# Patient Record
Sex: Female | Born: 1960 | Race: Black or African American | Hispanic: No | Marital: Married | Smoking: Never smoker
Health system: Southern US, Community
[De-identification: ages and names within clinical notes are randomized; demographics above are authoritative.]

## PROBLEM LIST (undated history)

## (undated) DIAGNOSIS — J45909 Unspecified asthma, uncomplicated: Secondary | ICD-10-CM

## (undated) DIAGNOSIS — R011 Cardiac murmur, unspecified: Secondary | ICD-10-CM

## (undated) DIAGNOSIS — K259 Gastric ulcer, unspecified as acute or chronic, without hemorrhage or perforation: Secondary | ICD-10-CM

## (undated) DIAGNOSIS — E0590 Thyrotoxicosis, unspecified without thyrotoxic crisis or storm: Secondary | ICD-10-CM

## (undated) DIAGNOSIS — K219 Gastro-esophageal reflux disease without esophagitis: Secondary | ICD-10-CM

## (undated) DIAGNOSIS — R7303 Prediabetes: Secondary | ICD-10-CM

## (undated) DIAGNOSIS — I10 Essential (primary) hypertension: Secondary | ICD-10-CM

## (undated) DIAGNOSIS — M1990 Unspecified osteoarthritis, unspecified site: Secondary | ICD-10-CM

## (undated) DIAGNOSIS — Z9889 Other specified postprocedural states: Secondary | ICD-10-CM

## (undated) DIAGNOSIS — Z87442 Personal history of urinary calculi: Secondary | ICD-10-CM

## (undated) DIAGNOSIS — D649 Anemia, unspecified: Secondary | ICD-10-CM

## (undated) DIAGNOSIS — E119 Type 2 diabetes mellitus without complications: Secondary | ICD-10-CM

## (undated) HISTORY — DX: Gastro-esophageal reflux disease without esophagitis: K21.9

## (undated) HISTORY — PX: CHOLECYSTECTOMY: SHX55

## (undated) HISTORY — PX: ABDOMINAL HYSTERECTOMY: SHX81

## (undated) HISTORY — DX: Cardiac murmur, unspecified: R01.1

## (undated) HISTORY — DX: Unspecified asthma, uncomplicated: J45.909

## (undated) HISTORY — DX: Type 2 diabetes mellitus without complications: E11.9

## (undated) HISTORY — DX: Unspecified osteoarthritis, unspecified site: M19.90

---

## 1989-04-01 HISTORY — PX: ABDOMINAL HYSTERECTOMY: SHX81

## 1996-04-01 HISTORY — PX: CHOLECYSTECTOMY: SHX55

## 1997-04-01 HISTORY — PX: CARPAL TUNNEL RELEASE: SHX101

## 2003-05-10 ENCOUNTER — Other Ambulatory Visit: Payer: Self-pay

## 2003-10-04 ENCOUNTER — Other Ambulatory Visit: Payer: Self-pay

## 2004-01-25 ENCOUNTER — Emergency Department: Payer: Self-pay | Admitting: Emergency Medicine

## 2004-04-01 HISTORY — PX: BREAST BIOPSY: SHX20

## 2005-01-09 ENCOUNTER — Emergency Department: Payer: Self-pay | Admitting: Emergency Medicine

## 2005-01-09 ENCOUNTER — Other Ambulatory Visit: Payer: Self-pay

## 2005-02-27 ENCOUNTER — Emergency Department: Payer: Self-pay | Admitting: Emergency Medicine

## 2005-03-06 ENCOUNTER — Ambulatory Visit: Payer: Self-pay

## 2005-03-12 ENCOUNTER — Ambulatory Visit: Payer: Self-pay

## 2005-03-26 ENCOUNTER — Emergency Department: Payer: Self-pay | Admitting: Emergency Medicine

## 2005-03-26 ENCOUNTER — Other Ambulatory Visit: Payer: Self-pay

## 2005-09-11 ENCOUNTER — Ambulatory Visit: Payer: Self-pay | Admitting: Unknown Physician Specialty

## 2005-09-15 ENCOUNTER — Emergency Department: Payer: Self-pay | Admitting: Emergency Medicine

## 2005-12-11 ENCOUNTER — Emergency Department: Payer: Self-pay | Admitting: Emergency Medicine

## 2006-04-28 ENCOUNTER — Emergency Department: Payer: Self-pay | Admitting: Emergency Medicine

## 2006-04-28 ENCOUNTER — Other Ambulatory Visit: Payer: Self-pay

## 2006-05-13 ENCOUNTER — Ambulatory Visit: Payer: Self-pay

## 2006-06-03 ENCOUNTER — Emergency Department: Payer: Self-pay | Admitting: Emergency Medicine

## 2007-04-02 DIAGNOSIS — T8859XA Other complications of anesthesia, initial encounter: Secondary | ICD-10-CM

## 2007-04-02 HISTORY — PX: COLONOSCOPY: SHX174

## 2007-04-02 HISTORY — DX: Other complications of anesthesia, initial encounter: T88.59XA

## 2012-09-02 ENCOUNTER — Ambulatory Visit: Payer: Self-pay

## 2012-12-14 ENCOUNTER — Emergency Department: Payer: Self-pay | Admitting: Emergency Medicine

## 2012-12-14 LAB — BASIC METABOLIC PANEL
BUN: 12 mg/dL (ref 7–18)
Chloride: 108 mmol/L — ABNORMAL HIGH (ref 98–107)
Creatinine: 0.81 mg/dL (ref 0.60–1.30)
EGFR (African American): >60
Osmolality: 277 (ref 275–301)
Sodium: 139 mmol/L (ref 136–145)

## 2012-12-14 LAB — CBC
HCT: 35.8 % (ref 35.0–47.0)
MCH: 27.5 pg (ref 26.0–34.0)
MCHC: 33.3 g/dL (ref 32.0–36.0)
MCV: 83 fL (ref 80–100)
Platelet: 487 10*3/uL — ABNORMAL HIGH (ref 150–440)
RBC: 4.34 10*6/uL (ref 3.80–5.20)
RDW: 14.8 % — ABNORMAL HIGH (ref 11.5–14.5)
WBC: 6.5 10*3/uL (ref 3.6–11.0)

## 2012-12-14 LAB — TROPONIN I: Troponin-I: <0.02 ng/mL

## 2012-12-14 LAB — CK TOTAL AND CKMB (NOT AT ARMC)
CK, Total: 79 U/L (ref 21–215)
CK-MB: <0.5 ng/mL — ABNORMAL LOW (ref 0.5–3.6)

## 2013-09-06 ENCOUNTER — Ambulatory Visit: Payer: Self-pay

## 2014-06-04 ENCOUNTER — Emergency Department: Payer: Self-pay | Admitting: Emergency Medicine

## 2014-09-14 ENCOUNTER — Encounter: Payer: Self-pay | Admitting: *Deleted

## 2014-09-14 ENCOUNTER — Ambulatory Visit
Admission: RE | Admit: 2014-09-14 | Discharge: 2014-09-14 | Disposition: A | Discharge status: Home / Self Care | Payer: Self-pay | Source: Ambulatory Visit | Attending: Oncology | Admitting: Oncology

## 2014-09-14 ENCOUNTER — Ambulatory Visit: Payer: Self-pay | Attending: Oncology | Admitting: *Deleted

## 2014-09-14 VITALS — BP 118/81 | HR 84 | Temp 98.6°F | Resp 18 | Ht 66.14 in | Wt 190.8 lb

## 2014-09-14 DIAGNOSIS — Z0000 Encounter for general adult medical examination without abnormal findings: Secondary | ICD-10-CM

## 2014-09-14 NOTE — Progress Notes (Signed)
Subjective:     Patient ID: Abigail Huffman, female   DOB: Aug 11, 1960, 54 y.o.   MRN: [id number]  HPI   Review of Systems     Objective:   Physical Exam  Pulmonary/Chest: Right breast exhibits no inverted nipple, no mass, no nipple discharge, no skin change and no tenderness. Left breast exhibits no inverted nipple, no mass, no nipple discharge, no skin change and no tenderness.  Abdominal: There is no splenomegaly or hepatomegaly. There is no guarding.  Genitourinary: Rectal exam shows external hemorrhoid. Rectal exam shows no mass and no tenderness. There is lesion on the right labia. There is no rash, tenderness or injury on the right labia. There is no rash, tenderness or injury on the left labia. Right adnexum displays no mass, no tenderness and no fullness. Left adnexum displays no mass, no tenderness and no fullness. No erythema, tenderness or bleeding in the vagina. No foreign body around the vagina. No vaginal discharge found.  History of hysterectomy for fibroids       Assessment:     54 year old Black female returns to [facility] for clinical breast exam, mammogram and pelvic exam.  Taught self breast awareness.  The thyroid is enlarged.  Patient states she mentioned this to her docotor, but she said it was how her neck was made.  The left eye is bulging and she states the eye doctor told her to follow on her thyroid.  The pelvic exam reveals a white 1 cm nodule like lesion in the right labia.  Patient has been screened for eligibility.  She does not have any insurance, Medicare or Medicaid.  She also meets financial eligibility.  Hand-out given on the Affordable Care Act.    Plan:  Screening mammogram ordered.  To again follow-up on her enlarged thyroid.  She is agreeable.  Follow up per [facility] protocol.

## 2014-09-28 ENCOUNTER — Telehealth: Payer: Self-pay | Admitting: *Deleted

## 2014-09-28 NOTE — Telephone Encounter (Signed)
Called and informed patient of her normal mammogram results.  She is to follow-up in one year with annual screening.

## 2014-12-10 ENCOUNTER — Emergency Department: Payer: Self-pay

## 2014-12-10 ENCOUNTER — Emergency Department
Admission: EM | Admit: 2014-12-10 | Discharge: 2014-12-10 | Disposition: A | Discharge status: Home / Self Care | Payer: Self-pay | Attending: Emergency Medicine | Admitting: Emergency Medicine

## 2014-12-10 DIAGNOSIS — M1712 Unilateral primary osteoarthritis, left knee: Secondary | ICD-10-CM | POA: Insufficient documentation

## 2014-12-10 DIAGNOSIS — I10 Essential (primary) hypertension: Secondary | ICD-10-CM | POA: Insufficient documentation

## 2014-12-10 HISTORY — DX: Essential (primary) hypertension: I10

## 2014-12-10 HISTORY — DX: Gastric ulcer, unspecified as acute or chronic, without hemorrhage or perforation: K25.9

## 2014-12-10 MED ORDER — HYDROCODONE-ACETAMINOPHEN 5-325 MG PO TABS
1.0000 | ORAL_TABLET | Freq: Once | ORAL | Status: AC
  Administered 2014-12-10: 1 via ORAL
  Filled 2014-12-10: qty 1

## 2014-12-10 MED ORDER — HYDROCODONE-ACETAMINOPHEN 5-325 MG PO TABS: 1.0000 | ORAL_TABLET | ORAL | Status: DC | PRN

## 2014-12-10 NOTE — ED Provider Notes (Signed)
[facility] Emergency Department Provider Note  ____________________________________________  Time seen: Approximately 1:10 PM  I have reviewed the triage vital signs and the nursing notes.   HISTORY  Chief Complaint Leg Pain   HPI Abigail Huffman is a 54 y.o. female is here with complaint of left knee pain for 3-4 days. Patient states it is worse this morning. She has been taking Tylenol without any improvement. She denies any injury recently or in the past to her knee. Pain is anterior, pain is increased with range of motion.She rates her pain currently as 10 out of 10. Patient has not been taking any anti-inflammatory is due to history of gastric ulcers.   Past Medical History  Diagnosis Date  . Hypertension   . Ulcer of gastric fundus     There are no active problems to display for this patient.   Past Surgical History  Procedure Laterality Date  . Breast biopsy Right     benign  . Cholecystectomy    . Abdominal hysterectomy      Current Outpatient Rx  Name  Route  Sig  Dispense  Refill  . HYDROcodone-acetaminophen (NORCO/VICODIN) 5-325 MG per tablet   Oral   Take 1 tablet by mouth every 4 (four) hours as needed for moderate pain.   20 tablet   0     Allergies Aspirin  No family history on file.  Social History Social History  Substance Use Topics  . Smoking status: Never Smoker   . Smokeless tobacco: Never Used  . Alcohol Use: No    Review of Systems Constitutional: No fever/chills Cardiovascular: Denies chest pain. Respiratory: Denies shortness of breath. Gastrointestinal: No abdominal pain.  No nausea, no vomiting.  Genitourinary: Negative for dysuria. Musculoskeletal: Negative for back pain. Left knee pain positive Skin: Negative for rash. Neurological: Negative for headaches, focal weakness or numbness.  10-point ROS otherwise negative.  ____________________________________________   PHYSICAL EXAM:  VITAL  SIGNS: ED Triage Vitals  Enc Vitals Group     BP 12/10/14 1153 127/82 mmHg     Pulse Rate 12/10/14 1153 83     Resp 12/10/14 1153 18     Temp 12/10/14 1153 98 F (36.7 C)     Temp Source 12/10/14 1153 Oral     SpO2 12/10/14 1153 100 %     Weight 12/10/14 1153 192 lb (87.091 kg)     Height 12/10/14 1153  (1.626 m)     Head Cir --      Peak Flow --      Pain Score 12/10/14 1154 10     Pain Loc --      Pain Edu? --      Excl. in GC? --     Constitutional: Alert and oriented. Well appearing and in no acute distress. Eyes: Conjunctivae are normal. PERRL. EOMI. Head: Atraumatic. Nose: No congestion/rhinnorhea. Neck: No stridor.   Cardiovascular: Normal rate, regular rhythm. Grossly normal heart sounds.  Good peripheral circulation. Respiratory: Normal respiratory effort.  No retractions. Lungs CTAB. Gastrointestinal: Soft and nontender. No distention.. Musculoskeletal: Examination of left knee no gross deformity was noted. There is some minimal soft tissue edema but no effusion was noted. There is minimal crepitus on range of motion. There is no restriction with range of motion. There is no redness, ecchymosis or abrasions noted on the knee. Neurologic:  Normal speech and language. No gross focal neurologic deficits are appreciated. No gait instability. Skin:  Skin is warm, dry  and intact. No rash noted. Psychiatric: Mood and affect are normal. Speech and behavior are normal.  ____________________________________________   LABS (all labs ordered are listed, but only abnormal results are displayed)  Labs Reviewed - No data to display   RADIOLOGY  Left knee x-ray per radiologist shows moderate degenerative joint disease. I, Tommi Rumps, personally viewed and evaluated these images (plain radiographs) as part of my medical decision making.  ____________________________________________   PROCEDURES  Procedure(s) performed: None  Critical Care performed:  No  ____________________________________________   INITIAL IMPRESSION / ASSESSMENT AND PLAN / ED COURSE  Pertinent labs & imaging results that were available during my care of the patient were reviewed by me and considered in my medical decision making (see chart for details)  Patient was made aware of her x-ray findings. Because she does have a history of ulcers she was given a prescription for Norco she is to follow-up with her primary care or Dr. Martha Clan if any continued problems..   ____________________________________________   FINAL CLINICAL IMPRESSION(S) / ED DIAGNOSES  Final diagnoses:  Primary osteoarthritis of left knee      Tommi Rumps, PA-C 12/10/14 2011  Minna Antis, MD 12/11/14 [phone]

## 2014-12-10 NOTE — ED Notes (Signed)
Pt c/o left leg pain, worse around the knee for the past 3-4 days.Marland Kitchendenies injury.Marland Kitchen

## 2014-12-10 NOTE — Discharge Instructions (Signed)
Arthritis, Nonspecific °Arthritis is pain, redness, warmth, or puffiness (inflammation) of a joint. The joint may be stiff or hurt when you move it. One or more joints may be affected. There are many types of arthritis. Your doctor may not know what type you have right away. The most common cause of arthritis is wear and tear on the joint (osteoarthritis). °HOME CARE  °· Only take medicine as told by your doctor. °· Rest the joint as much as possible. °· Raise (elevate) your joint if it is puffy. °· Use crutches if the painful joint is in your leg. °· Drink enough fluids to keep your pee (urine) clear or pale yellow. °· Follow your doctor's diet instructions. °· Use cold packs for very bad joint pain for 10 to 15 minutes every hour. Ask your doctor if it is okay for you to use hot packs. °· Exercise as told by your doctor. °· Take a warm shower if you have stiffness in the morning. °· Move your sore joints throughout the day. °GET HELP RIGHT AWAY IF:  °· You have a fever. °· You have very bad joint pain, puffiness, or redness. °· You have many joints that are painful and puffy. °· You are not getting better with treatment. °· You have very bad back pain or leg weakness. °· You cannot control when you poop (bowel movement) or pee (urinate). °· You do not feel better in 24 hours or are getting worse. °· You are having side effects from your medicine. °MAKE SURE YOU:  °· Understand these instructions. °· Will watch your condition. °· Will get help right away if you are not doing well or get worse. °Document Released: 06/12/2009 Document Revised: 09/17/2011 Document Reviewed: 06/12/2009 °ExitCare® Patient Information ©2015 [employer]. This information is not intended to replace advice given to you by your health care provider. Make sure you discuss any questions you have with your health care provider. ° °

## 2015-09-20 ENCOUNTER — Encounter: Payer: Self-pay | Admitting: *Deleted

## 2015-09-20 ENCOUNTER — Ambulatory Visit
Admission: RE | Admit: 2015-09-20 | Discharge: 2015-09-20 | Disposition: A | Discharge status: Home / Self Care | Payer: Self-pay | Source: Ambulatory Visit | Attending: Internal Medicine | Admitting: Internal Medicine

## 2015-09-20 ENCOUNTER — Ambulatory Visit: Payer: Self-pay | Attending: Internal Medicine | Admitting: *Deleted

## 2015-09-20 VITALS — BP 134/80 | HR 66 | Temp 97.2°F | Ht 65.35 in | Wt 185.2 lb

## 2015-09-20 DIAGNOSIS — Z0000 Encounter for general adult medical examination without abnormal findings: Secondary | ICD-10-CM

## 2015-09-20 NOTE — Progress Notes (Signed)
Subjective:     Patient ID: Abigail Huffman, female   DOB: [birth year], 55 y.o.   MRN: [id number]  HPI   Review of Systems     Objective:   Physical Exam  Pulmonary/Chest: Right breast exhibits no inverted nipple, no mass, no nipple discharge and no tenderness. Left breast exhibits no inverted nipple, no mass, no nipple discharge, no skin change and no tenderness. Breasts are asymmetrical.  Left breast larger than the right breast       Assessment:     55 year old Black female returns to [facility] for annual screening.  Clinical breast exam unremarkable.  Taught self breast awareness.  Thyroid is still enlarged.  Patient states she did have a biopsy of her thyroid that was benign, but that she is supposed to follow up again in a few months.  Patient has been screened for eligibility.  She does not have any insurance, Medicare or Medicaid.  She also meets financial eligibility.  Hand-out given on the Affordable Care Act.    Plan:     Screening mammogram ordered.  Will follow-up per [facility] protocol.

## 2015-09-20 NOTE — Patient Instructions (Signed)
Gave patient hand-out, Women Staying Healthy, Active and Well from [facility], with education on breast health, pap smears, heart and colon health. 

## 2015-09-20 NOTE — Progress Notes (Signed)
Letter mailed from [facility] to inform patient of her normal mammogram results.  Patient is to follow-up with annual screening in one year.  HSIS to Christy. 

## 2016-01-17 DIAGNOSIS — J3089 Other allergic rhinitis: Secondary | ICD-10-CM | POA: Insufficient documentation

## 2016-01-17 DIAGNOSIS — M5416 Radiculopathy, lumbar region: Secondary | ICD-10-CM | POA: Insufficient documentation

## 2016-01-17 DIAGNOSIS — J4540 Moderate persistent asthma, uncomplicated: Secondary | ICD-10-CM | POA: Insufficient documentation

## 2016-01-17 DIAGNOSIS — M539 Dorsopathy, unspecified: Secondary | ICD-10-CM

## 2016-01-17 DIAGNOSIS — I10 Essential (primary) hypertension: Secondary | ICD-10-CM | POA: Insufficient documentation

## 2016-01-17 DIAGNOSIS — K219 Gastro-esophageal reflux disease without esophagitis: Secondary | ICD-10-CM | POA: Insufficient documentation

## 2016-01-17 DIAGNOSIS — M51369 Other intervertebral disc degeneration, lumbar region without mention of lumbar back pain or lower extremity pain: Secondary | ICD-10-CM

## 2016-01-17 DIAGNOSIS — M5136 Other intervertebral disc degeneration, lumbar region: Secondary | ICD-10-CM | POA: Insufficient documentation

## 2016-01-17 HISTORY — DX: Radiculopathy, lumbar region: M54.16

## 2016-01-17 HISTORY — DX: Other intervertebral disc degeneration, lumbar region without mention of lumbar back pain or lower extremity pain: M51.369

## 2016-08-08 ENCOUNTER — Other Ambulatory Visit: Payer: Self-pay | Admitting: Internal Medicine

## 2016-08-08 DIAGNOSIS — Z1231 Encounter for screening mammogram for malignant neoplasm of breast: Secondary | ICD-10-CM

## 2016-09-13 DIAGNOSIS — R3129 Other microscopic hematuria: Secondary | ICD-10-CM

## 2016-09-13 DIAGNOSIS — Z780 Asymptomatic menopausal state: Secondary | ICD-10-CM | POA: Insufficient documentation

## 2016-09-13 HISTORY — DX: Other microscopic hematuria: R31.29

## 2016-09-20 ENCOUNTER — Ambulatory Visit
Admission: RE | Admit: 2016-09-20 | Discharge: 2016-09-20 | Disposition: A | Discharge status: Home / Self Care | Payer: 59 | Source: Ambulatory Visit | Attending: Internal Medicine | Admitting: Internal Medicine

## 2016-09-20 DIAGNOSIS — Z1231 Encounter for screening mammogram for malignant neoplasm of breast: Secondary | ICD-10-CM | POA: Diagnosis not present

## 2016-09-29 NOTE — Progress Notes (Signed)
09/30/2016 1:54 PM   Abigail Huffman [birth year] [id number]  Referring provider: Leotis Shames, MD [phone] [facility] [address] [facility]: [address] [place], [place] [address]  Chief Complaint  Patient presents with  . Hematuria    New Patient    HPI: Patient is a 56 -year-old [place] American female who presents today as a referral from their PCP, Dr. Leotis Shames, for microscopic hematuria.    Patient was found to have microscopic hematuria on 09/06/2016 and 09/13/2016 with 4-10 RBC's/hpf.  Her urine culture was negative.  Patient doesn't have a prior history of microscopic hematuria.    She does have a history of nephrolithiasis in the 80's which she passed spontaneously.  She does not have a history of trauma to the genitourinary tract or malignancies of the genitourinary tract.   She does not have a family medical history of nephrolithiasis, malignancies of the genitourinary tract or hematuria.   Today, she is having symptoms of nocturia x 3.    She is experiencing any suprapubic pain, abdominal pain and flank pain.  This has been long term as she has been diagnosed with MS.  She denies any recent fevers, chills, nausea or vomiting.    She has not had any recent imaging studies.   She is not a smoker.   She is exposed to secondhand smoke.  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   She has HTN.  She has a high BMI.     PMH: Past Medical History:  Diagnosis Date  . Arthritis   . Asthma   . Diabetes (HCC)   . GERD (gastroesophageal reflux disease)   . Heart murmur   . Hypertension   . Ulcer of gastric fundus     Surgical History: Past Surgical History:  Procedure Laterality Date  . ABDOMINAL HYSTERECTOMY    . BREAST BIOPSY Right 2006   benign  . CHOLECYSTECTOMY      Home Medications:  Allergies as of 09/30/2016      Reactions   Aspirin Rash      Medication List       Accurate as of 09/30/16  1:54 PM. Always use your most recent med  list.          albuterol 108 (90 Base) MCG/ACT inhaler Commonly known as:  PROVENTIL HFA;VENTOLIN HFA Inhale into the lungs.   beclomethasone 40 MCG/ACT inhaler Commonly known as:  QVAR Inhale into the lungs.   cetirizine 10 MG chewable tablet Commonly known as:  ZYRTEC Chew by mouth.   esomeprazole 40 MG capsule Commonly known as:  NEXIUM Take by mouth.   ferrous sulfate 325 (65 FE) MG tablet Take by mouth.   HYDROcodone-acetaminophen 5-325 MG tablet Commonly known as:  NORCO/VICODIN Take 1 tablet by mouth every 4 (four) hours as needed for moderate pain.   metoprolol succinate 25 MG 24 hr tablet Commonly known as:  TOPROL-XL Take by mouth.   PARoxetine Mesylate 7.5 MG Caps Take by mouth.   tiZANidine 2 MG tablet Commonly known as:  ZANAFLEX TAKE 1 TO 2 TABLETS BY MOUTH AT BEDTIME AS NEEDED. CAUTION DROWSINESS   traMADol 50 MG tablet Commonly known as:  ULTRAM Take by mouth.       Allergies:  Allergies  Allergen Reactions  . Aspirin Rash    Family History: Family History  Problem Relation Age of Onset  . Hematuria Father   . Prostate cancer Father   . Sickle cell anemia Sister   .  Breast cancer Neg Hx     Social History:  reports that she has never smoked. She has never used smokeless tobacco. She reports that she does not drink alcohol or use drugs.  ROS: UROLOGY Frequent Urination?: No Hard to postpone urination?: No Burning/pain with urination?: No Get up at night to urinate?: Yes Leakage of urine?: No Urine stream starts and stops?: No Trouble starting stream?: No Do you have to strain to urinate?: No Blood in urine?: Yes Urinary tract infection?: No Sexually transmitted disease?: No Injury to kidneys or bladder?: No Painful intercourse?: No Weak stream?: No Currently pregnant?: No Vaginal bleeding?: No Last menstrual period?: n  Gastrointestinal Nausea?: No Vomiting?: No Indigestion/heartburn?: Yes Diarrhea?:  No Constipation?: No  Constitutional Fever: No Night sweats?: Yes Weight loss?: No Fatigue?: No  Skin Skin rash/lesions?: No Itching?: No  Eyes Blurred vision?: No Double vision?: No  Ears/Nose/Throat Sore throat?: No Sinus problems?: Yes  Hematologic/Lymphatic Swollen glands?: No Easy bruising?: No  Cardiovascular Leg swelling?: No Chest pain?: No  Respiratory Cough?: No Shortness of breath?: Yes  Endocrine Excessive thirst?: Yes  Musculoskeletal Back pain?: Yes Joint pain?: Yes  Neurological Headaches?: No Dizziness?: No  Psychologic Depression?: No Anxiety?: No  Physical Exam: BP 131/80   Pulse 82   Ht 5\' 3"  (1.6 m)   Wt 173 lb (78.5 kg)   LMP 09/19/1988   BMI 30.65 kg/m   Constitutional: Well nourished. Alert and oriented, No acute distress. HEENT: [place] AT, moist mucus membranes. Trachea midline, no masses. Cardiovascular: No clubbing, cyanosis, or edema. Respiratory: Normal respiratory effort, no increased work of breathing. GI: Abdomen is soft, non tender, non distended, no abdominal masses. Liver and spleen not palpable.  No hernias appreciated.  Stool sample for occult testing is not indicated.   GU: No CVA tenderness.  No bladder fullness or masses.   Skin: No rashes, bruises or suspicious lesions. Lymph: No cervical or inguinal adenopathy. Neurologic: Grossly intact, no focal deficits, moving all 4 extremities. Psychiatric: Normal mood and affect.  Laboratory Data: Serum creatinine was 0.8 in June 2018   Assessment & Plan:    1. Microscopic hematuria  - I explained to the patient that there are a number of causes that can be associated with blood in the urine, such as stones, UTI's, damage to the urinary tract and/or cancer.  - At this time, I felt that the patient warranted further urologic evaluation.   The AUA guidelines state that a CT urogram is the preferred imaging study to evaluate hematuria.  - I explained to the patient  that a contrast material will be injected into a vein and that in rare instances, an allergic reaction can result and may even life threatening   The patient denies any allergies to contrast, iodine and/or seafood and is not taking metformin.  - Her reproductive status is s/p hysterectomy  - Following the imaging study,  I've recommended a cystoscopy. I described how this is performed, typically in an office setting with a flexible cystoscope. We described the risks, benefits, and possible side effects, the most common of which is a minor amount of blood in the urine and/or burning which usually resolves in 24 to 48 hours.    - The patient had the opportunity to ask questions which were answered. Based upon this discussion, the patient is willing to proceed. Therefore, I've ordered: a CT Urogram and cystoscopy.  - The patient will return following all of the above for discussion of the  results.   - BUN + creatinine    Return for CT Urogram report and cystoscopy.  These notes generated with voice recognition software. I apologize for typographical errors.  Michiel Cowboy, PA-C  [facility] [address], [address] [place], [place] [address] [phone]

## 2016-09-30 ENCOUNTER — Ambulatory Visit (INDEPENDENT_AMBULATORY_CARE_PROVIDER_SITE_OTHER): Payer: PRIVATE HEALTH INSURANCE | Admitting: Urology

## 2016-09-30 ENCOUNTER — Encounter: Payer: Self-pay | Admitting: Urology

## 2016-09-30 VITALS — BP 131/80 | HR 82 | Ht 63.0 in | Wt 173.0 lb

## 2016-09-30 DIAGNOSIS — R3129 Other microscopic hematuria: Secondary | ICD-10-CM | POA: Diagnosis not present

## 2016-09-30 NOTE — Patient Instructions (Addendum)
Hematuria, Adult Hematuria is blood in your urine. It can be caused by a bladder infection, kidney infection, prostate infection, kidney stone, or cancer of your urinary tract. Infections can usually be treated with medicine, and a kidney stone usually will pass through your urine. If neither of these is the cause of your hematuria, further workup to find out the reason may be needed. It is very important that you tell your health care provider about any blood you see in your urine, even if the blood stops without treatment or happens without causing pain. Blood in your urine that happens and then stops and then happens again can be a symptom of a very serious condition. Also, pain is not a symptom in the initial stages of many urinary cancers. Follow these instructions at home:  Drink lots of fluid, 3-4 quarts a day. If you have been diagnosed with an infection, cranberry juice is especially recommended, in addition to large amounts of water.  Avoid caffeine, tea, and carbonated beverages because they tend to irritate the bladder.  Avoid alcohol because it may irritate the prostate.  Take all medicines as directed by your health care provider.  If you were prescribed an antibiotic medicine, finish it all even if you start to feel better.  If you have been diagnosed with a kidney stone, follow your health care provider's instructions regarding straining your urine to catch the stone.  Empty your bladder often. Avoid holding urine for long periods of time.  After a bowel movement, women should cleanse front to back. Use each tissue only once.  Empty your bladder before and after sexual intercourse if you are a female. Contact a health care provider if:  You develop back pain.  You have a fever.  You have a feeling of sickness in your stomach (nausea) or vomiting.  Your symptoms are not better in 3 days. Return sooner if you are getting worse. Get help right away if:  You develop  severe vomiting and are unable to keep the medicine down.  You develop severe back or abdominal pain despite taking your medicines.  You begin passing a large amount of blood or clots in your urine.  You feel extremely weak or faint, or you pass out. This information is not intended to replace advice given to you by your health care provider. Make sure you discuss any questions you have with your health care provider. Document Released: 03/18/2005 Document Revised: 08/24/2015 Document Reviewed: 11/16/2012 Elsevier Interactive Patient Education  2017 [employer]  CT Scan A computed tomography (CT) scan is a specialized X-ray scan. It uses X-rays and a computer to make pictures of different areas of your body. A CT scan can offer more detailed information than a regular X-ray exam. The CT scan provides data about internal organs, soft tissue structures, blood vessels, and bones. The CT scanner is a large machine that takes pictures of your body as you move through the opening. Tell a health care provider about:  Any allergies you have.  All medicines you are taking, including vitamins, herbs, eye drops, creams, and over-the-counter medicines.  Any problems you or family members have had with anesthetic medicines.  Any blood disorders you have.  Any surgeries you have had.  Any medical conditions you have. What are the risks? Generally, this is a safe procedure. However, as with any procedure, problems can occur. Possible problems include:  An allergic reaction to the contrast material.  Development of cancer from excessive exposure to   radiation. The risk of this is small.  What happens before the procedure?  The day before the test, stop drinking caffeinated beverages. These include energy drinks, tea, soda, coffee, and hot chocolate.  On the day of the test: ? About 4 hours before the test, stop eating and drinking anything but water as advised by your health care  provider. ? Avoid wearing jewelry. You will have to partly or fully undress and wear a hospital gown. What happens during the procedure?  You will be asked to lie on a table with your arms above your head.  If contrast dye is to be used for the test, an IV tube will be inserted in your arm. The contrast dye will be injected into the IV tube. You might feel warm, or you may get a metallic taste in your mouth.  The table you will be lying on will move into a large machine that will do the scanning.  You will be able to see, hear, and talk to the person running the machine while you are in it. Follow that person's directions.  The CT machine will move around you to take pictures. Do not move while it is scanning. This helps to get a good image.  When the best possible pictures have been taken, the machine will be turned off. The table will be moved out of the machine. The IV tube will then be removed. What happens after the procedure? Ask your health care provider when to follow up for your test results. This information is not intended to replace advice given to you by your health care provider. Make sure you discuss any questions you have with your health care provider. Document Released: 04/25/2004 Document Revised: 08/24/2015 Document Reviewed: 11/23/2012 Elsevier Interactive Patient Education  2017 [employer]  Cystoscopy Cystoscopy is a procedure that is used to help diagnose and sometimes treat conditions that affect that lower urinary tract. The lower urinary tract includes the bladder and the tube that drains urine from the bladder out of the body (urethra). Cystoscopy is performed with a thin, tube-shaped instrument with a light and camera at the end (cystoscope). The cystoscope may be hard (rigid) or flexible, depending on the goal of the procedure.The cystoscope is inserted through the urethra, into the bladder. Cystoscopy may be recommended if you have:  Urinary tractinfections  that keep coming back (recurring).  Blood in the urine (hematuria).  Loss of bladder control (urinary incontinence) or an overactive bladder.  Unusual cells found in a urine sample.  A blockage in the urethra.  Painful urination.  An abnormality in the bladder found during an intravenous pyelogram (IVP) or CT scan.  Cystoscopy may also be done to remove a sample of tissue to be examined under a microscope (biopsy). Tell a health care provider about:  Any allergies you have.  All medicines you are taking, including vitamins, herbs, eye drops, creams, and over-the-counter medicines.  Any problems you or family members have had with anesthetic medicines.  Any blood disorders you have.  Any surgeries you have had.  Any medical conditions you have.  Whether you are pregnant or may be pregnant. What are the risks? Generally, this is a safe procedure. However, problems may occur, including:  Infection.  Bleeding.  Allergic reactions to medicines.  Damage to other structures or organs.  What happens before the procedure?  Ask your health care provider about: ? Changing or stopping your regular medicines. This is especially important if you are taking   diabetes medicines or blood thinners. ? Taking medicines such as aspirin and ibuprofen. These medicines can thin your blood. Do not take these medicines before your procedure if your health care provider instructs you not to.  Follow instructions from your health care provider about eating or drinking restrictions.  You may be given antibiotic medicine to help prevent infection.  You may have an exam or testing, such as X-rays of the bladder, urethra, or kidneys.  You may have urine tests to check for signs of infection.  Plan to have someone take you home after the procedure. What happens during the procedure?  To reduce your risk of infection,your health care team will wash or sanitize their hands.  You will be  given one or more of the following: ? A medicine to help you relax (sedative). ? A medicine to numb the area (local anesthetic).  The area around the opening of your urethra will be cleaned.  The cystoscope will be passed through your urethra into your bladder.  Germ-free (sterile)fluid will flow through the cystoscope to fill your bladder. The fluid will stretch your bladder so that your surgeon can clearly examine your bladder walls.  The cystoscope will be removed and your bladder will be emptied. The procedure may vary among health care providers and hospitals. What happens after the procedure?  You may have some soreness or pain in your abdomen and urethra. Medicines will be available to help you.  You may have some blood in your urine.  Do not drive for 24 hours if you received a sedative. This information is not intended to replace advice given to you by your health care provider. Make sure you discuss any questions you have with your health care provider. Document Released: 03/15/2000 Document Revised: 07/27/2015 Document Reviewed: 02/02/2015 Elsevier Interactive Patient Education  2017 [employer]  

## 2016-10-01 LAB — BUN+CREAT
BUN / CREAT RATIO: 16 (ref 9–23)
BUN: 14 mg/dL (ref 6–24)
Creatinine, Ser: 0.87 mg/dL (ref 0.57–1.00)
GFR calc non Af Amer: 76 mL/min/{1.73_m2} (ref >59)
GFR, EST AFRICAN AMERICAN: 87 mL/min/{1.73_m2} (ref >59)

## 2016-10-18 ENCOUNTER — Ambulatory Visit
Admission: RE | Admit: 2016-10-18 | Discharge: 2016-10-18 | Disposition: A | Discharge status: Home / Self Care | Payer: 59 | Source: Ambulatory Visit | Attending: Urology | Admitting: Urology

## 2016-10-18 DIAGNOSIS — Z90710 Acquired absence of both cervix and uterus: Secondary | ICD-10-CM | POA: Insufficient documentation

## 2016-10-18 DIAGNOSIS — K5730 Diverticulosis of large intestine without perforation or abscess without bleeding: Secondary | ICD-10-CM | POA: Diagnosis not present

## 2016-10-18 DIAGNOSIS — R911 Solitary pulmonary nodule: Secondary | ICD-10-CM | POA: Insufficient documentation

## 2016-10-18 DIAGNOSIS — R3129 Other microscopic hematuria: Secondary | ICD-10-CM | POA: Diagnosis present

## 2016-10-18 DIAGNOSIS — Z9049 Acquired absence of other specified parts of digestive tract: Secondary | ICD-10-CM | POA: Diagnosis not present

## 2016-10-18 MED ORDER — IOPAMIDOL (ISOVUE-300) INJECTION 61%
125.0000 mL | Freq: Once | INTRAVENOUS | Status: AC | PRN
Start: 2016-10-18 — End: 2016-10-18
  Administered 2016-10-18: 125 mL via INTRAVENOUS

## 2016-10-22 ENCOUNTER — Ambulatory Visit (INDEPENDENT_AMBULATORY_CARE_PROVIDER_SITE_OTHER): Payer: PRIVATE HEALTH INSURANCE | Admitting: Urology

## 2016-10-22 ENCOUNTER — Encounter: Payer: Self-pay | Admitting: Urology

## 2016-10-22 VITALS — BP 125/79 | HR 94 | Ht 63.0 in | Wt 172.4 lb

## 2016-10-22 DIAGNOSIS — R3129 Other microscopic hematuria: Secondary | ICD-10-CM

## 2016-10-22 LAB — URINALYSIS, COMPLETE
BILIRUBIN UA: NEGATIVE
Glucose, UA: NEGATIVE
KETONES UA: NEGATIVE
LEUKOCYTES UA: NEGATIVE
Nitrite, UA: NEGATIVE
PROTEIN UA: NEGATIVE
SPEC GRAV UA: 1.025 (ref 1.005–1.030)
UUROB: 0.2 mg/dL (ref 0.2–1.0)
pH, UA: 5.5 (ref 5.0–7.5)

## 2016-10-22 LAB — MICROSCOPIC EXAMINATION: WBC UA: NONE SEEN /HPF (ref 0–?)

## 2016-10-22 MED ORDER — CIPROFLOXACIN HCL 500 MG PO TABS
500.0000 mg | ORAL_TABLET | Freq: Once | ORAL | Status: AC
  Administered 2016-10-22: 500 mg via ORAL

## 2016-10-22 MED ORDER — LIDOCAINE HCL 2 % EX GEL
1.0000 "application " | Freq: Once | CUTANEOUS | Status: AC
  Administered 2016-10-22: 1 via URETHRAL

## 2016-10-22 NOTE — Progress Notes (Signed)
   10/22/16  CC:  Chief Complaint  Patient presents with  . Cysto    HPI: Here for cystoscopy to complete micro-hematuria eval.  CT scan normal.  Blood pressure 125/79, pulse 94, height 5\' 3"  (1.6 m), weight 78.2 kg (172 lb 6.4 oz), last menstrual period 09/19/1988. NED. A&Ox3.   No respiratory distress   Abd soft, NT, ND Normal external genitalia with patent urethral meatus  Cystoscopy Procedure Note  Patient identification was confirmed, informed consent was obtained, and patient was prepped using Betadine solution.  Lidocaine jelly was administered per urethral meatus.    Preoperative abx where received prior to procedure.    Procedure: - Flexible cystoscope introduced, without any difficulty.   - Thorough search of the bladder revealed:    normal urethral meatus    normal urothelium    no stones    no ulcers     no tumors    no urethral polyps    no trabeculation  - Ureteral orifices were normal in position and appearance.  Post-Procedure: - Patient tolerated the procedure well  Assessment/ Plan: Microscopic hematuria clinically insignificant.  No additional work-up/testing necessary.  Repeat eval in 5 years if still present.   Crist Fat, MD

## 2017-06-19 ENCOUNTER — Other Ambulatory Visit: Payer: Self-pay | Admitting: Internal Medicine

## 2017-06-19 DIAGNOSIS — Z1231 Encounter for screening mammogram for malignant neoplasm of breast: Secondary | ICD-10-CM

## 2017-08-08 ENCOUNTER — Other Ambulatory Visit: Payer: Self-pay

## 2017-08-08 ENCOUNTER — Emergency Department: Payer: 59

## 2017-08-08 ENCOUNTER — Encounter: Payer: Self-pay | Admitting: Emergency Medicine

## 2017-08-08 ENCOUNTER — Emergency Department
Admission: EM | Admit: 2017-08-08 | Discharge: 2017-08-08 | Disposition: A | Discharge status: Home / Self Care | Payer: 59 | Attending: Emergency Medicine | Admitting: Emergency Medicine

## 2017-08-08 DIAGNOSIS — I10 Essential (primary) hypertension: Secondary | ICD-10-CM | POA: Insufficient documentation

## 2017-08-08 DIAGNOSIS — G35 Multiple sclerosis: Secondary | ICD-10-CM | POA: Insufficient documentation

## 2017-08-08 DIAGNOSIS — J4540 Moderate persistent asthma, uncomplicated: Secondary | ICD-10-CM | POA: Diagnosis not present

## 2017-08-08 DIAGNOSIS — R0602 Shortness of breath: Secondary | ICD-10-CM | POA: Insufficient documentation

## 2017-08-08 DIAGNOSIS — Z79899 Other long term (current) drug therapy: Secondary | ICD-10-CM | POA: Insufficient documentation

## 2017-08-08 DIAGNOSIS — E119 Type 2 diabetes mellitus without complications: Secondary | ICD-10-CM | POA: Insufficient documentation

## 2017-08-08 DIAGNOSIS — R0789 Other chest pain: Secondary | ICD-10-CM | POA: Diagnosis not present

## 2017-08-08 DIAGNOSIS — M79672 Pain in left foot: Secondary | ICD-10-CM | POA: Insufficient documentation

## 2017-08-08 DIAGNOSIS — R911 Solitary pulmonary nodule: Secondary | ICD-10-CM | POA: Insufficient documentation

## 2017-08-08 DIAGNOSIS — R079 Chest pain, unspecified: Secondary | ICD-10-CM

## 2017-08-08 LAB — BASIC METABOLIC PANEL
ANION GAP: 5 (ref 5–15)
BUN: 14 mg/dL (ref 6–20)
CHLORIDE: 109 mmol/L (ref 101–111)
CO2: 26 mmol/L (ref 22–32)
Calcium: 9 mg/dL (ref 8.9–10.3)
Creatinine, Ser: 0.7 mg/dL (ref 0.44–1.00)
GFR calc non Af Amer: >60 mL/min (ref >60)
GLUCOSE: 94 mg/dL (ref 65–99)
POTASSIUM: 4.4 mmol/L (ref 3.5–5.1)
Sodium: 140 mmol/L (ref 135–145)

## 2017-08-08 LAB — CBC WITH DIFFERENTIAL/PLATELET
BASOS ABS: 0.1 10*3/uL (ref 0–0.1)
BASOS PCT: 1 %
Eosinophils Absolute: 0.3 10*3/uL (ref 0–0.7)
Eosinophils Relative: 5 %
HEMATOCRIT: 39.4 % (ref 35.0–47.0)
HEMOGLOBIN: 12.8 g/dL (ref 12.0–16.0)
LYMPHS PCT: 51 %
Lymphs Abs: 3.5 10*3/uL (ref 1.0–3.6)
MCH: 27.8 pg (ref 26.0–34.0)
MCHC: 32.4 g/dL (ref 32.0–36.0)
MCV: 85.8 fL (ref 80.0–100.0)
Monocytes Absolute: 0.3 10*3/uL (ref 0.2–0.9)
Monocytes Relative: 5 %
NEUTROS PCT: 38 %
Neutro Abs: 2.6 10*3/uL (ref 1.4–6.5)
Platelets: 372 10*3/uL (ref 150–440)
RBC: 4.59 MIL/uL (ref 3.80–5.20)
RDW: 14.8 % — ABNORMAL HIGH (ref 11.5–14.5)
WBC: 6.8 10*3/uL (ref 3.6–11.0)

## 2017-08-08 LAB — CK: CK TOTAL: 88 U/L (ref 38–234)

## 2017-08-08 LAB — FIBRIN DERIVATIVES D-DIMER (ARMC ONLY): FIBRIN DERIVATIVES D-DIMER (ARMC): 509.21 ng{FEU}/mL — AB (ref 0.00–499.00)

## 2017-08-08 LAB — TROPONIN I: Troponin I: <0.03 ng/mL (ref <0.03)

## 2017-08-08 MED ORDER — MORPHINE SULFATE (PF) 4 MG/ML IV SOLN
4.0000 mg | Freq: Once | INTRAVENOUS | Status: AC
  Administered 2017-08-08: 4 mg via INTRAVENOUS
  Filled 2017-08-08: qty 1

## 2017-08-08 MED ORDER — IOPAMIDOL (ISOVUE-370) INJECTION 76%
75.0000 mL | Freq: Once | INTRAVENOUS | Status: AC | PRN
  Administered 2017-08-08: 75 mL via INTRAVENOUS

## 2017-08-08 MED ORDER — ONDANSETRON HCL 4 MG/2ML IJ SOLN
4.0000 mg | Freq: Once | INTRAMUSCULAR | Status: AC
  Administered 2017-08-08: 4 mg via INTRAVENOUS
  Filled 2017-08-08: qty 2

## 2017-08-08 NOTE — ED Triage Notes (Signed)
Pt to ED via POV c/o left sided chest pain that radiates into her left arm and back. Pt states that the pain started with her having cramps in her feet around 0400 and the pain went up her legs and into her chest and back and left arm. Pt states that it feels like a "knot" in her chest. Pt states that she is having shortness of breath as well. Pt states that she has had nausea but no vomiting. Pt is in NAD at this time, able to speak in complete sentences.

## 2017-08-08 NOTE — ED Provider Notes (Signed)
[facility] Emergency Department Provider Note  ___________________________________________   First MD Initiated Contact with Patient 08/08/17 [phone]     (approximate)  I have reviewed the triage vital signs and the nursing notes.   HISTORY  Chief Complaint Chest Pain   HPI Abigail Huffman is a 57 y.o. female with history of diabetes, multiple sclerosis as well as hypertension and a family history of heart disease with her mother dying from heart attack at 27 years old who is presenting to the emergency department today with sudden onset chest pain at 4 AM that woke her from sleep.  She says the pain started as cramping in her left foot which then traveled to her chest.  She says the pain now feels like a pressure type pain in her chest and is radiating through to the left side of her back.  She says the pain is a 10 out of 10 and constant and associated with shortness of breath.  She also says that the pain worsens with deep breathing.  She says that this pain is different from the pain that she experiences with her multiple sclerosis.  Past Medical History:  Diagnosis Date  . Arthritis   . Asthma   . Diabetes (HCC)   . GERD (gastroesophageal reflux disease)   . Heart murmur   . Hypertension   . Ulcer of gastric fundus     Patient Active Problem List   Diagnosis Date Noted  . Hematuria, microscopic 09/13/2016  . Postmenopausal 09/13/2016  . Asthma, stable, moderate persistent 01/17/2016  . Chronic non-seasonal allergic rhinitis 01/17/2016  . Essential hypertension 01/17/2016  . Gastroesophageal reflux disease without esophagitis 01/17/2016  . Lumbar radiculopathy, chronic 01/17/2016  . Multilevel degenerative disc disease 01/17/2016    Past Surgical History:  Procedure Laterality Date  . ABDOMINAL HYSTERECTOMY    . BREAST BIOPSY Right 2006   benign  . CHOLECYSTECTOMY      Prior to Admission medications   Medication Sig Start Date End Date  Taking? Authorizing Provider  albuterol (PROVENTIL HFA;VENTOLIN HFA) 108 (90 Base) MCG/ACT inhaler Inhale into the lungs. 01/17/16   [provider]  beclomethasone (QVAR) 40 MCG/ACT inhaler Inhale into the lungs. 01/17/16   [provider]  cetirizine (ZYRTEC) 10 MG chewable tablet Chew by mouth. 01/17/16   [provider]  esomeprazole (NEXIUM) 40 MG capsule Take by mouth. 01/17/16   [provider]  ferrous sulfate 325 (65 FE) MG tablet Take by mouth.    [provider]  gabapentin (NEURONTIN) 300 MG capsule Take by mouth. 10/15/16 10/15/17  [provider]  HYDROcodone-acetaminophen (NORCO/VICODIN) 5-325 MG per tablet Take 1 tablet by mouth every 4 (four) hours as needed for moderate pain. 12/10/14   Tommi Rumps, PA-C  metoprolol succinate (TOPROL-XL) 25 MG 24 hr tablet Take by mouth. 07/12/16 07/12/17  [provider]  PARoxetine Mesylate 7.5 MG CAPS Take by mouth. 07/12/16   [provider]  tiZANidine (ZANAFLEX) 2 MG tablet Take by mouth. 10/11/16   [provider]  traMADol (ULTRAM) 50 MG tablet Take by mouth. 07/12/16   [provider]    Allergies Aspirin  Family History  Problem Relation Age of Onset  . Hematuria Father   . Prostate cancer Father   . Sickle cell anemia Sister   . Breast cancer Neg Hx     Social History Social History   Tobacco Use  . Smoking status: Never Smoker  . Smokeless  tobacco: Never Used  Substance Use Topics  . Alcohol use: No  . Drug use: No    Review of Systems  Constitutional: No fever/chills Eyes: No visual changes. ENT: No sore throat. Cardiovascular: As above Respiratory: As above Gastrointestinal: No abdominal pain.  No nausea, no vomiting.  No diarrhea.  No constipation. Genitourinary: Negative for dysuria. Musculoskeletal: As above Skin: Negative for rash. Neurological: Negative for headaches, focal weakness or  numbness.   ____________________________________________   PHYSICAL EXAM:  VITAL SIGNS: ED Triage Vitals  Enc Vitals Group     BP 08/08/17 0907 128/85     Pulse Rate 08/08/17 0907 63     Resp 08/08/17 0907 16     Temp 08/08/17 0907 98.5 F (36.9 C)     Temp Source 08/08/17 0907 Oral     SpO2 08/08/17 0907 100 %     Weight 08/08/17 0908 176 lb (79.8 kg)     Height 08/08/17 0908 5\' 3"  (1.6 m)     Head Circumference --      Peak Flow --      Pain Score 08/08/17 0908 10     Pain Loc --      Pain Edu? --      Excl. in GC? --     Constitutional: Alert and oriented. Well appearing and in no acute distress. Eyes: Conjunctivae are normal.  Head: Atraumatic. Nose: No congestion/rhinnorhea. Mouth/Throat: Mucous membranes are moist.  Neck: No stridor.   Cardiovascular: Normal rate, regular rhythm. Grossly normal heart sounds.  Good peripheral circulation with equal and bilateral radial as well as dorsalis pedis pulses.  Chest pain is reproducible under the left breast. Respiratory: Normal respiratory effort.  No retractions. Lungs CTAB. Gastrointestinal: Soft and nontender. No distention. No CVA tenderness. Musculoskeletal: No lower extremity tenderness nor edema.  No joint effusions.  Reproducible pain to the left thoracic back. Neurologic:  Normal speech and language. No gross focal neurologic deficits are appreciated. Skin:  Skin is warm, dry and intact. No rash noted. Psychiatric: Mood and affect are normal. Speech and behavior are normal.  ____________________________________________   LABS (all labs ordered are listed, but only abnormal results are displayed)  Labs Reviewed  CBC WITH DIFFERENTIAL/PLATELET - Abnormal; Notable for the following components:      Result Value   RDW 14.8 (*)    All other components within normal limits  FIBRIN DERIVATIVES D-DIMER ([facility] ONLY) - Abnormal; Notable for the following components:   Fibrin derivatives D-dimer [facility]) 509.21 (*)     All other components within normal limits  BASIC METABOLIC PANEL  TROPONIN I  CK   ____________________________________________  EKG  ED ECG REPORT I, Arelia Longest, the attending physician, personally viewed and interpreted this ECG.   Date: 08/08/2017  EKG Time: 0900  Rate: 76  Rhythm: normal sinus rhythm with sinus arrhythmia  Axis: Normal  Intervals:none  ST&T Change: No ST segment elevation or depression.  No abnormal T wave inversion.  ____________________________________________  RADIOLOGY  Chest x-ray without acute finding  CT of the chest with normal appearance of the thoracic aorta nor is there any evidence of large central pulmonary emboli.  Pulmonary nodule with recommendation for possibly no follow-up if patient is low risk. ____________________________________________   PROCEDURES  Procedure(s) performed:   Procedures  Critical Care performed:   ____________________________________________   INITIAL IMPRESSION / ASSESSMENT AND PLAN / ED COURSE  Pertinent labs & imaging results that were available during my care of the patient  were reviewed by me and considered in my medical decision making (see chart for details).  Differential diagnosis includes, but is not limited to, ACS, aortic dissection, pulmonary embolism, cardiac tamponade, pneumothorax, pneumonia, pericarditis, myocarditis, GI-related causes including esophagitis/gastritis, and musculoskeletal chest wall pain.   As part of my medical decision making, I reviewed the following data within the electronic MEDICAL RECORD NUMBER Notes from outpatient pain clinic visits.  Patient on Lyrica for chronic pain.  Appears to have a chronic pain history and her low back as well as difficulty breathing when lying flat.  Note from April 29 at [facility] also notes a "significant amount of myofascial pain."  Patient resting without any distress at this time.  Requesting ginger ale.  Reassuring work-up including  approximately 6-hour troponin which was negative.  Normal EKG and reassuring CT angiography without signs of dissection or pulmonary embolus.  Patient aware of pulmonary nodules.  She will be following up with her cardiologist, Dr. Winnifred Friar.  She reports she had a stress test approximately 1 year ago which was negative.  She will also be following with her primary care doctor regarding the pulmonary nodules.  I feel that this is most likely part of the patient's syndrome of chronic pain.  The pain was reproducible.  Very reassuring lab work as well as imaging.  Patient understanding of the diagnosis as well as treatment plan willing to comply.  Will be discharged at this time. ____________________________________________   FINAL CLINICAL IMPRESSION(S) / ED DIAGNOSES  Chest pain.  Pulmonary nodules.    NEW MEDICATIONS STARTED DURING THIS VISIT:  New Prescriptions   No medications on file     Note:  This document was prepared using Dragon voice recognition software and may include unintentional dictation errors.     Myrna Blazer, MD 08/08/17 [phone]

## 2017-08-22 ENCOUNTER — Encounter: Payer: Self-pay | Admitting: Urology

## 2017-08-22 ENCOUNTER — Ambulatory Visit: Payer: PRIVATE HEALTH INSURANCE

## 2017-08-22 ENCOUNTER — Ambulatory Visit (INDEPENDENT_AMBULATORY_CARE_PROVIDER_SITE_OTHER): Payer: PRIVATE HEALTH INSURANCE | Admitting: Urology

## 2017-08-22 VITALS — BP 123/83 | HR 76 | Resp 16 | Ht 63.0 in | Wt 174.6 lb

## 2017-08-22 DIAGNOSIS — R3129 Other microscopic hematuria: Secondary | ICD-10-CM

## 2017-08-22 LAB — MICROSCOPIC EXAMINATION: WBC, UA: NONE SEEN /hpf (ref 0–5)

## 2017-08-22 LAB — URINALYSIS, COMPLETE
Bilirubin, UA: NEGATIVE
Glucose, UA: NEGATIVE
KETONES UA: NEGATIVE
LEUKOCYTES UA: NEGATIVE
NITRITE UA: NEGATIVE
Protein, UA: NEGATIVE
Specific Gravity, UA: 1.02 (ref 1.005–1.030)
Urobilinogen, Ur: 0.2 mg/dL (ref 0.2–1.0)
pH, UA: 5 (ref 5.0–7.5)

## 2017-08-22 NOTE — Progress Notes (Signed)
08/22/2017 10:32 AM   Abigail Huffman 02/16/1961 [id number]  Referring provider: Leotis Shames, MD [phone] [facility] [address] [facility] [place], [place] [address]  No chief complaint on file.   HPI: 57 year old female who presents today for follow-up for history of microscopic hematuria.  She underwent a full work-up last year including negative CT urogram 09/2016 and cystoscopy which was negative for any GU pathology.  She returns today reporting that she scopic blood in her urine was advised by her primary doctor to come back for further evaluation given its persistence.  UA today which does show 3-10 red blood cells per high-power field.  She remains currently asymptomatic.  She is a never smoker.  She does report that a few weeks ago, she did have what appeared to be blood-tinged urine after strenuous exercise, mowing the lawn in a very hot day.  This resolved immediately.   PMH: Past Medical History:  Diagnosis Date  . Arthritis   . Asthma   . Diabetes (HCC)   . GERD (gastroesophageal reflux disease)   . Heart murmur   . Hypertension   . Ulcer of gastric fundus     Surgical History: Past Surgical History:  Procedure Laterality Date  . ABDOMINAL HYSTERECTOMY    . BREAST BIOPSY Right 2006   benign  . CHOLECYSTECTOMY      Home Medications:  Allergies as of 08/22/2017      Reactions   Gabapentin Other (See Comments)   Pregabalin Other (See Comments)   Hematuria   Aspirin Rash      Medication List        Accurate as of 08/22/17 10:32 AM. Always use your most recent med list.          albuterol 108 (90 Base) MCG/ACT inhaler Commonly known as:  PROVENTIL HFA;VENTOLIN HFA Inhale into the lungs.   beclomethasone 40 MCG/ACT inhaler Commonly known as:  QVAR Inhale into the lungs.   cetirizine 10 MG chewable tablet Commonly known as:  ZYRTEC Chew by mouth.   esomeprazole 40 MG capsule Commonly known as:  NEXIUM Take by mouth.   ferrous sulfate  325 (65 FE) MG tablet Take by mouth.   gabapentin 300 MG capsule Commonly known as:  NEURONTIN Take by mouth.   HYDROcodone-acetaminophen 5-325 MG tablet Commonly known as:  NORCO/VICODIN Take 1 tablet by mouth every 4 (four) hours as needed for moderate pain.   metoprolol succinate 25 MG 24 hr tablet Commonly known as:  TOPROL-XL Take by mouth.   PARoxetine Mesylate 7.5 MG Caps Take by mouth.   tiZANidine 2 MG tablet Commonly known as:  ZANAFLEX Take by mouth.   traMADol 50 MG tablet Commonly known as:  ULTRAM Take by mouth.       Allergies:  Allergies  Allergen Reactions  . Gabapentin Other (See Comments)  . Pregabalin Other (See Comments)    Hematuria  . Aspirin Rash    Family History: Family History  Problem Relation Age of Onset  . Hematuria Father   . Prostate cancer Father   . Sickle cell anemia Sister   . Breast cancer Neg Hx     Social History:  reports that she has never smoked. She has never used smokeless tobacco. She reports that she does not drink alcohol or use drugs.  ROS: UROLOGY Frequent Urination?: Yes Hard to postpone urination?: No Burning/pain with urination?: No Get up at night to urinate?: Yes Leakage of urine?: No Urine stream starts and  stops?: No Trouble starting stream?: No Do you have to strain to urinate?: No Blood in urine?: Yes Urinary tract infection?: No Sexually transmitted disease?: No Injury to kidneys or bladder?: No Painful intercourse?: No Weak stream?: No Currently pregnant?: No Vaginal bleeding?: No  Gastrointestinal Nausea?: Yes Vomiting?: No Indigestion/heartburn?: No Diarrhea?: No Constipation?: No  Constitutional Fever: No Night sweats?: No Weight loss?: No Fatigue?: No  Skin Skin rash/lesions?: No Itching?: No  Eyes Blurred vision?: No Double vision?: No  Ears/Nose/Throat Sore throat?: No Sinus problems?: Yes  Hematologic/Lymphatic Swollen glands?: No Easy bruising?:  No  Cardiovascular Leg swelling?: No Chest pain?: No  Respiratory Cough?: No Shortness of breath?: No  Endocrine Excessive thirst?: No  Musculoskeletal Back pain?: Yes Joint pain?: No  Neurological Headaches?: No Dizziness?: No  Psychologic Depression?: No Anxiety?: No  Physical Exam: BP 123/83   Pulse 76   Resp 16   Ht 5\' 3"  (1.6 m)   Wt 174 lb 9.6 oz (79.2 kg)   LMP 09/19/1988   SpO2 96%   BMI 30.93 kg/m   Constitutional:  Alert and oriented, No acute distress. HEENT: [place] AT, moist mucus membranes.  Trachea midline, no masses. Cardiovascular: No clubbing, cyanosis, or edema. Respiratory: Normal respiratory effort, no increased work of breathing. GI: Abdomen is soft, nontender, nondistended, no abdominal masses GU: No CVA tenderness. Skin: No rashes, bruises or suspicious lesions. Neurologic: Grossly intact, no focal deficits, moving all 4 extremities. Psychiatric: Normal mood and affect.  Laboratory Data: Lab Results  Component Value Date   WBC 6.8 08/08/2017   HGB 12.8 08/08/2017   HCT 39.4 08/08/2017   MCV 85.8 08/08/2017   PLT 372 08/08/2017    Lab Results  Component Value Date   CREATININE 0.70 08/08/2017    Urinalysis Results for orders placed or performed in visit on 08/22/17  Microscopic Examination  Result Value Ref Range   WBC, UA None seen 0 - 5 /hpf   RBC, UA 3-10 (A) 0 - 2 /hpf   Epithelial Cells (non renal) 0-10 0 - 10 /hpf   Mucus, UA Present (A) Not Estab.   Bacteria, UA Moderate (A) None seen/Few  Urinalysis, Complete  Result Value Ref Range   Specific Gravity, UA 1.020 1.005 - 1.030   pH, UA 5.0 5.0 - 7.5   Color, UA Yellow Yellow   Appearance Ur Clear Clear   Leukocytes, UA Negative Negative   Protein, UA Negative Negative/Trace   Glucose, UA Negative Negative   Ketones, UA Negative Negative   RBC, UA 2+ (A) Negative   Bilirubin, UA Negative Negative   Urobilinogen, Ur 0.2 0.2 - 1.0 mg/dL   Nitrite, UA Negative  Negative   Microscopic Examination See below:     Pertinent Imaging: Results for orders placed during the hospital encounter of 10/18/16  CT HEMATURIA WORKUP   Narrative CLINICAL DATA:  Hematuria. History of cholecystectomy and hysterectomy.  EXAM: CT ABDOMEN AND PELVIS WITHOUT AND WITH CONTRAST  TECHNIQUE: Multidetector CT imaging of the abdomen and pelvis was performed following the standard protocol before and following the bolus administration of intravenous contrast.  CONTRAST:  ISOVUE-300 IOPAMIDOL (ISOVUE-300) INJECTION 61%  COMPARISON:  03/26/2005  FINDINGS: Lower chest: 3 mm left lower lobe pulmonary nodule was present previously but less evident due to the thicker slice collimation used on that study. Twelve year interval stability consistent with benign etiology.  Hepatobiliary: 7 mm hypoattenuating lesion medial segment left liver likely a cyst. Small area of low attenuation  in the anterior liver, adjacent to the falciform ligament, is in a characteristic location for focal fatty deposition. Gallbladder surgically absent. No intrahepatic or extrahepatic biliary dilation.  Pancreas: No focal mass lesion. No dilatation of the main duct. No intraparenchymal cyst. No peripancreatic edema.  Spleen: No splenomegaly. No focal mass lesion.  Adrenals/Urinary Tract: No adrenal nodule or mass.  Precontrast imaging shows no stones in either kidney or ureter. No bladder stones.  Imaging after IV contrast administration shows no enhancing lesion in either kidney.  Delayed imaging shows no wall thickening or soft tissue filling defect in either intrarenal collecting system or renal pelvis. Both ureters are well opacified and have normal CT imaging features. No focal bladder wall abnormality.  Stomach/Bowel: Stomach is distended with fluid but otherwise unremarkable. Duodenum is normally positioned as is the ligament of Treitz. No small bowel wall thickening.  No small bowel dilatation. The terminal ileum is normal. The appendix is normal. Peristalsis noted splenic flexure. Diverticular changes are noted in the left colon without evidence of diverticulitis.  Vascular/Lymphatic: No abdominal aortic aneurysm. No abdominal aortic atherosclerotic calcification. Portal vein and superior mesenteric vein are patent. There is no gastrohepatic or hepatoduodenal ligament lymphadenopathy. No intraperitoneal or retroperitoneal lymphadenopathy. No pelvic sidewall lymphadenopathy.  Reproductive: Uterus surgically absent.  There is no adnexal mass.  Other: No intraperitoneal free fluid.  Musculoskeletal: Bone windows reveal no worrisome lytic or sclerotic osseous lesions.  3 mm left lower lobe pulmonary nodule (image 13 series 16)  IMPRESSION: 1. No CT findings to explain the patient's history of hematuria. 2. Left colonic diverticulosis. 3. 3 mm left lower lobe pulmonary nodule stable since 2006, consistent with benign etiology. 4. Prior cholecystectomy and hysterectomy.   Electronically Signed   By: Kennith Center M.D.   On: 10/18/2016 14:04     Assessment & Plan:    1. Microscopic hematuria Status post negative work-up less than 1 year ago including CT urogram and cystoscopy We discussed that given her minimal risk factors including lack of smoking history, at this point in time no additional work-up is needed Would recommend repeat evaluation in 3 to 5 years if microscopic hematuria persists She is agreeable with this plan, we reviewed signs and symptoms including pain or significant episodes of gross hematuria necessitating sooner return - Urinalysis, Complete  Vanna Scotland, MD  [facility] [address], [address] [place], [place] [address] [phone]

## 2017-09-22 ENCOUNTER — Ambulatory Visit
Admission: RE | Admit: 2017-09-22 | Discharge: 2017-09-22 | Disposition: A | Discharge status: Home / Self Care | Payer: 59 | Source: Ambulatory Visit | Attending: Internal Medicine | Admitting: Internal Medicine

## 2017-09-22 DIAGNOSIS — Z1231 Encounter for screening mammogram for malignant neoplasm of breast: Secondary | ICD-10-CM

## 2018-01-05 ENCOUNTER — Emergency Department
Admission: EM | Admit: 2018-01-05 | Discharge: 2018-01-05 | Disposition: A | Discharge status: Home / Self Care | Payer: 59 | Attending: Emergency Medicine | Admitting: Emergency Medicine

## 2018-01-05 ENCOUNTER — Encounter: Payer: Self-pay | Admitting: Emergency Medicine

## 2018-01-05 ENCOUNTER — Emergency Department: Payer: 59

## 2018-01-05 ENCOUNTER — Other Ambulatory Visit: Payer: Self-pay

## 2018-01-05 DIAGNOSIS — E119 Type 2 diabetes mellitus without complications: Secondary | ICD-10-CM | POA: Diagnosis not present

## 2018-01-05 DIAGNOSIS — J4540 Moderate persistent asthma, uncomplicated: Secondary | ICD-10-CM | POA: Diagnosis not present

## 2018-01-05 DIAGNOSIS — R200 Anesthesia of skin: Secondary | ICD-10-CM | POA: Diagnosis present

## 2018-01-05 DIAGNOSIS — Z79899 Other long term (current) drug therapy: Secondary | ICD-10-CM | POA: Diagnosis not present

## 2018-01-05 DIAGNOSIS — I10 Essential (primary) hypertension: Secondary | ICD-10-CM | POA: Insufficient documentation

## 2018-01-05 LAB — CBC
HEMATOCRIT: 37.9 % (ref 35.0–47.0)
HEMOGLOBIN: 12.5 g/dL (ref 12.0–16.0)
MCH: 28.6 pg (ref 26.0–34.0)
MCHC: 32.9 g/dL (ref 32.0–36.0)
MCV: 87 fL (ref 80.0–100.0)
Platelets: 360 10*3/uL (ref 150–440)
RBC: 4.36 MIL/uL (ref 3.80–5.20)
RDW: 14.9 % — ABNORMAL HIGH (ref 11.5–14.5)
WBC: 6.9 10*3/uL (ref 3.6–11.0)

## 2018-01-05 LAB — COMPREHENSIVE METABOLIC PANEL
ALT: 19 U/L (ref 0–44)
ANION GAP: 5 (ref 5–15)
AST: 14 U/L — ABNORMAL LOW (ref 15–41)
Albumin: 4.2 g/dL (ref 3.5–5.0)
Alkaline Phosphatase: 64 U/L (ref 38–126)
BILIRUBIN TOTAL: 0.4 mg/dL (ref 0.3–1.2)
BUN: 11 mg/dL (ref 6–20)
CO2: 29 mmol/L (ref 22–32)
Calcium: 9.5 mg/dL (ref 8.9–10.3)
Chloride: 107 mmol/L (ref 98–111)
Creatinine, Ser: 0.87 mg/dL (ref 0.44–1.00)
GFR calc Af Amer: >60 mL/min (ref >60)
Glucose, Bld: 94 mg/dL (ref 70–99)
POTASSIUM: 3.9 mmol/L (ref 3.5–5.1)
Sodium: 141 mmol/L (ref 135–145)
TOTAL PROTEIN: 7.7 g/dL (ref 6.5–8.1)

## 2018-01-05 LAB — DIFFERENTIAL
Basophils Absolute: 0 10*3/uL (ref 0–0.1)
Basophils Relative: 0 %
EOS ABS: 0.3 10*3/uL (ref 0–0.7)
EOS PCT: 5 %
LYMPHS ABS: 3.4 10*3/uL (ref 1.0–3.6)
Lymphocytes Relative: 49 %
MONOS PCT: 4 %
Monocytes Absolute: 0.3 10*3/uL (ref 0.2–0.9)
NEUTROS PCT: 42 %
Neutro Abs: 2.9 10*3/uL (ref 1.4–6.5)

## 2018-01-05 LAB — TROPONIN I

## 2018-01-05 MED ORDER — PREDNISONE 10 MG PO TABS: 10.0000 mg | ORAL_TABLET | Freq: Every day | ORAL | 0 refills | Status: DC

## 2018-01-05 MED ORDER — GADOBUTROL 1 MMOL/ML IV SOLN
7.5000 mL | Freq: Once | INTRAVENOUS | Status: AC | PRN
  Administered 2018-01-05: 7.5 mL via INTRAVENOUS

## 2018-01-05 MED ORDER — GADOBUTROL 1 MMOL/ML IV SOLN: 8.0000 mL | Freq: Once | INTRAVENOUS | Status: DC | PRN

## 2018-01-05 MED ORDER — PREDNISONE 20 MG PO TABS
60.0000 mg | ORAL_TABLET | Freq: Once | ORAL | Status: AC
  Administered 2018-01-05: 60 mg via ORAL
  Filled 2018-01-05: qty 3

## 2018-01-05 NOTE — ED Provider Notes (Signed)
[facility] Emergency Department Provider Note  Time seen: 3:09 PM  I have reviewed the triage vital signs and the nursing notes.   HISTORY  Chief Complaint Numbness    HPI Abigail Huffman is a 57 y.o. female with a past medical history of gastric reflux, diabetes, arthritis, asthma, multiple sclerosis presents to the emergency department for left-sided weakness/numbness.  According to the patient for the past 4 days she has been experiencing weakness in her left face left arm along with numbness sensation.  Patient denies any confusion or slurred speech, does state moderate headache over the last several days as well which is somewhat atypical.  Patient states a history of multiple sclerosis used to see neurology but has not seen one in quite a while and does not currently have a neurologist.   Past Medical History:  Diagnosis Date  . Arthritis   . Asthma   . Diabetes (HCC)   . GERD (gastroesophageal reflux disease)   . Heart murmur   . Hypertension   . Ulcer of gastric fundus     Patient Active Problem List   Diagnosis Date Noted  . Hematuria, microscopic 09/13/2016  . Postmenopausal 09/13/2016  . Asthma, stable, moderate persistent 01/17/2016  . Chronic non-seasonal allergic rhinitis 01/17/2016  . Essential hypertension 01/17/2016  . Gastroesophageal reflux disease without esophagitis 01/17/2016  . Lumbar radiculopathy, chronic 01/17/2016  . Multilevel degenerative disc disease 01/17/2016    Past Surgical History:  Procedure Laterality Date  . ABDOMINAL HYSTERECTOMY    . BREAST BIOPSY Right 2006   benign  . CHOLECYSTECTOMY      Prior to Admission medications   Medication Sig Start Date End Date Taking? Authorizing Provider  albuterol (PROVENTIL HFA;VENTOLIN HFA) 108 (90 Base) MCG/ACT inhaler Inhale into the lungs. 01/17/16   [provider]  beclomethasone (QVAR) 40 MCG/ACT inhaler Inhale into the lungs. 01/17/16   [provider]  cetirizine (ZYRTEC) 10 MG chewable tablet Chew by mouth. 01/17/16   [provider]  esomeprazole (NEXIUM) 40 MG capsule Take by mouth. 01/17/16   [provider]  ferrous sulfate 325 (65 FE) MG tablet Take by mouth.    [provider]  gabapentin (NEURONTIN) 300 MG capsule Take by mouth. 10/15/16 10/15/17  [provider]  HYDROcodone-acetaminophen (NORCO/VICODIN) 5-325 MG per tablet Take 1 tablet by mouth every 4 (four) hours as needed for moderate pain. 12/10/14   Tommi Rumps, PA-C  metoprolol succinate (TOPROL-XL) 25 MG 24 hr tablet Take by mouth. 07/12/16 07/12/17  [provider]  PARoxetine Mesylate 7.5 MG CAPS Take by mouth. 07/12/16   [provider]  tiZANidine (ZANAFLEX) 2 MG tablet Take by mouth. 10/11/16   [provider]  traMADol (ULTRAM) 50 MG tablet Take by mouth. 07/12/16   [provider]    Allergies  Allergen Reactions  . Gabapentin Other (See Comments)  . Pregabalin Other (See Comments)    Hematuria  . Aspirin Rash    Family History  Problem Relation Age of Onset  . Hematuria Father   . Prostate cancer Father   . Sickle cell anemia Sister   . Breast cancer Neg Hx     Social History Social History   Tobacco Use  . Smoking status: Never Smoker  . Smokeless tobacco: Never Used  Substance Use Topics  . Alcohol use: No  . Drug use: No    Review of Systems Constitutional: Negative for fever. Eyes: Negative for visual complaints  Cardiovascular: Negative for chest pain. Respiratory: Negative for shortness of breath. Gastrointestinal: Negative for abdominal pain, vomiting.  Positive for nausea. Musculoskeletal: Negative for musculoskeletal complaints Skin: Negative for skin complaints  Neurological: Moderate headache.  Left arm and face weakness/numbness. All other ROS negative  ____________________________________________   PHYSICAL EXAM:  VITAL SIGNS: ED Triage  Vitals [01/05/18 1227]  Enc Vitals Group     BP (!) 166/90     Pulse Rate 78     Resp 20     Temp 98 F (36.7 C)     Temp Source Oral     SpO2 100 %     Weight 175 lb (79.4 kg)     Height 5\' 3"  (1.6 m)     Head Circumference      Peak Flow      Pain Score 10     Pain Loc      Pain Edu?      Excl. in GC?    Constitutional: Alert and oriented. Well appearing and in no distress. Eyes: Normal exam ENT   Head: Normocephalic and atraumatic.   Mouth/Throat: Mucous membranes are moist. Cardiovascular: Normal rate, regular rhythm. No murmur Respiratory: Normal respiratory effort without tachypnea nor retractions. Breath sounds are clear  Gastrointestinal: Soft and nontender. No distention. Musculoskeletal: Nontender with normal range of motion in all extremities.  Neurologic:  Normal speech and language.  No obvious facial droop on my examination, no discernible weakness on my examination.  Appears to have 5/5 motor in all extremities, no pronator drift.  Cranial nerves appear intact besides subjective decreased sensation/numbness to the left arm and left face. Skin:  Skin is warm, dry and intact.  Psychiatric: Mood and affect are normal. Speech and behavior are normal.   ____________________________________________    EKG  EKG reviewed and interpreted by myself shows normal sinus rhythm at 80 bpm with a narrow QRS, normal axis, normal intervals, no concerning ST changes.  ____________________________________________    RADIOLOGY  CT head negative for acute abnormality  ____________________________________________   INITIAL IMPRESSION / ASSESSMENT AND PLAN / ED COURSE  Pertinent labs & imaging results that were available during my care of the patient were reviewed by me and considered in my medical decision making (see chart for details).  Patient presents to the emergency department for left-sided weakness and numbness of her face and arm over the past 4 days.   Patient states a history of multiple sclerosis.  Differential would include MS flare, CVA, ICH, paresthesias.  Patient's labs are largely at baseline, CT scan of the head is nonrevealing, EKG is reassuring.  Will obtain an MRI with and without contrast to help evaluate for MS flare or underlying CVA.  Patient agreeable to plan of care.  Currently there is no objective neurological finding on examination however the patient does state subjective numbness to her left face and left arm.  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  I discussed the patient with neurosurgery, Dr. Riley Nearing.  He does not believe that this finding would be causing the patient's symptoms.  Given the patient's history of MS I believe it would be beneficial to start the patient on a prednisone taper and have the patient follow-up with neurology as well as her primary care doctor tomorrow.  Patient states she will call tomorrow morning for an appointment.  I also discussed very strict return precautions for any worsening symptoms especially of any worsening weakness confusion slurred speech.  Patient agreeable to plan  of care.  ____________________________________________   FINAL CLINICAL IMPRESSION(S) / ED DIAGNOSES  Left-sided weakness/numbness    Minna Antis, MD 01/05/18 [phone]

## 2018-01-05 NOTE — ED Notes (Signed)
Pt returned from MRI. ABCs intact. NAD. 

## 2018-01-05 NOTE — ED Notes (Signed)
Paduchowski MD at bedside. 

## 2018-01-05 NOTE — ED Notes (Signed)
Pt to MRI. ABCs intact. NAD 

## 2018-01-05 NOTE — Discharge Instructions (Addendum)
As we discussed please call the number for neurology tomorrow morning to arrange a follow-up appointment as soon as possible.  Please inform them that you are seen in the emergency department and asked to follow-up as soon as possible.  Return to the emergency department for any increased weakness, numbness, worsening headache, any slurred speech, confusion, or any other symptom personally concerning to yourself.

## 2018-01-05 NOTE — ED Notes (Signed)
Pt seen ambulating out of tx area with steady gait. ABCs intact. RR even and unlabored. Color WNL. NAD.

## 2018-01-05 NOTE — ED Triage Notes (Signed)
L facial and arm numbness x 4 days. Slight facial droop. Grips equal. States does have severe headache. Denies fall. Denies use of blood thinners.

## 2018-06-22 ENCOUNTER — Other Ambulatory Visit: Payer: Self-pay | Admitting: Physical Medicine and Rehabilitation

## 2018-06-22 DIAGNOSIS — G8929 Other chronic pain: Secondary | ICD-10-CM

## 2018-06-22 DIAGNOSIS — M5441 Lumbago with sciatica, right side: Principal | ICD-10-CM

## 2018-06-22 DIAGNOSIS — M5442 Lumbago with sciatica, left side: Principal | ICD-10-CM

## 2018-07-07 IMAGING — MG MM DIGITAL SCREENING BILAT W/ TOMO W/ CAD
8 series · 8 of 24 positions shown · non-contrast
Comparison: Previous exam(s).

CLINICAL DATA: Screening.

EXAM:
DIGITAL SCREENING BILATERAL MAMMOGRAM WITH TOMO AND CAD

[L CC synth-2D]
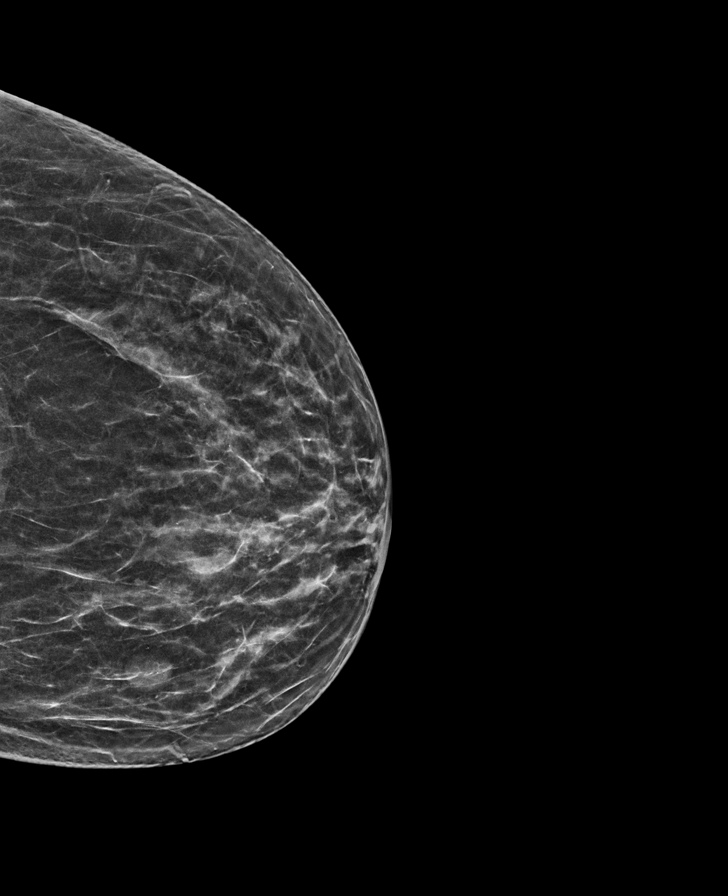

[R MLO synth-2D]
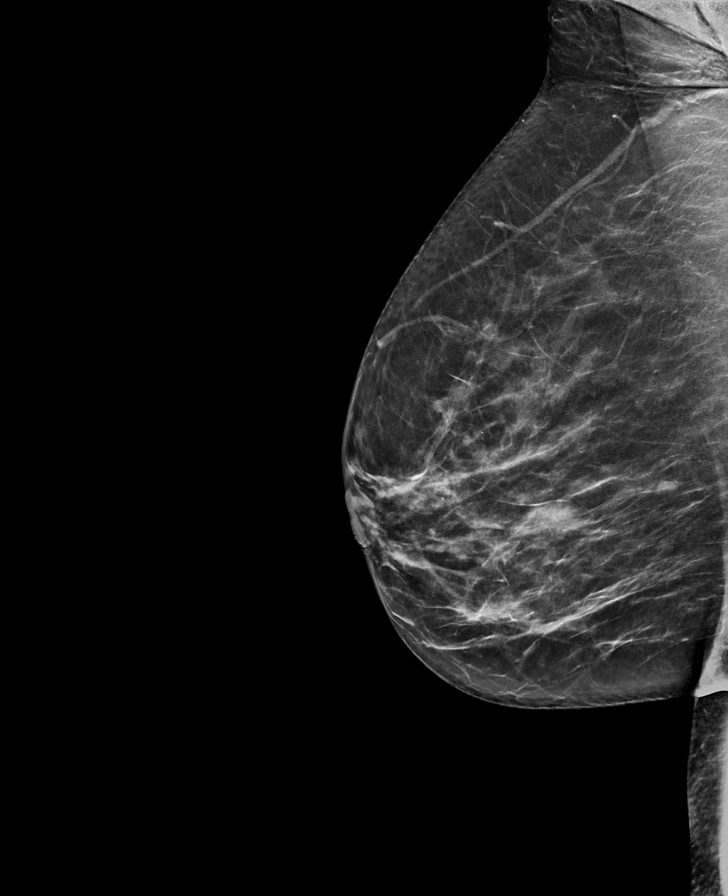

[R CC synth-2D]
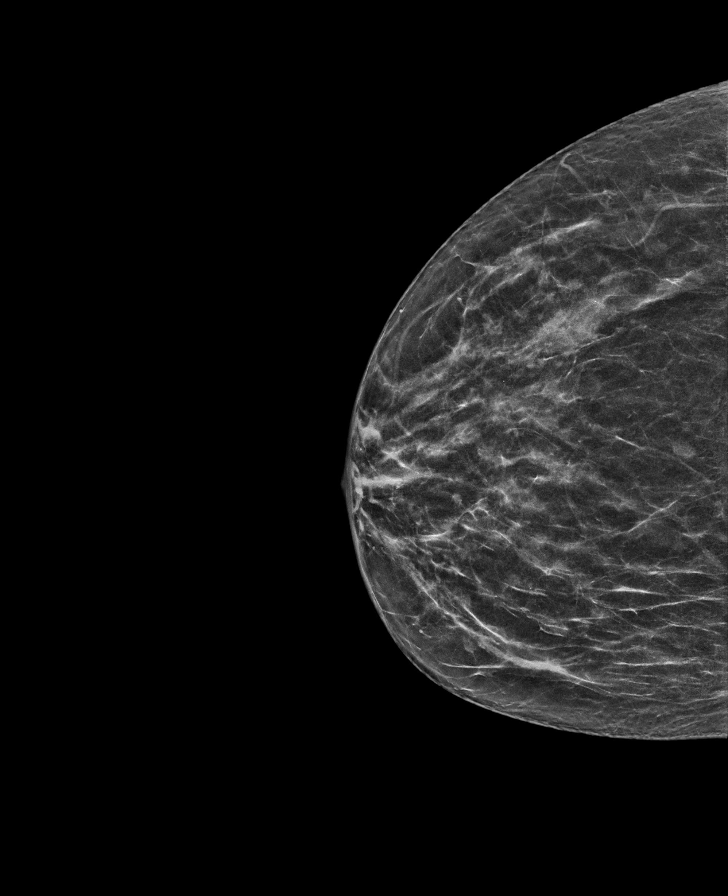

[L MLO synth-2D]
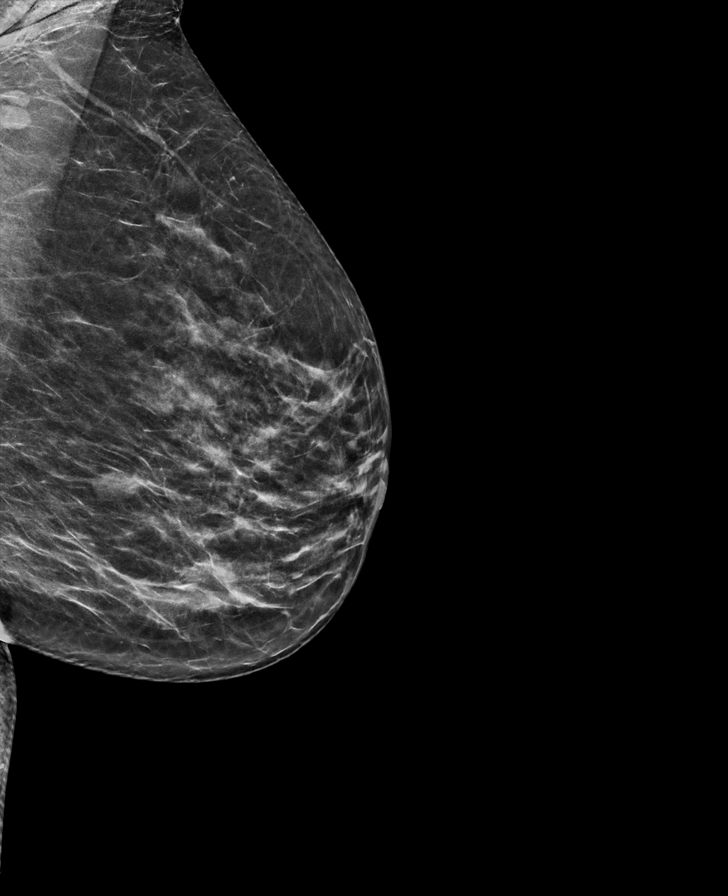

[R MLO tomo · tomo slice 32/63.0]
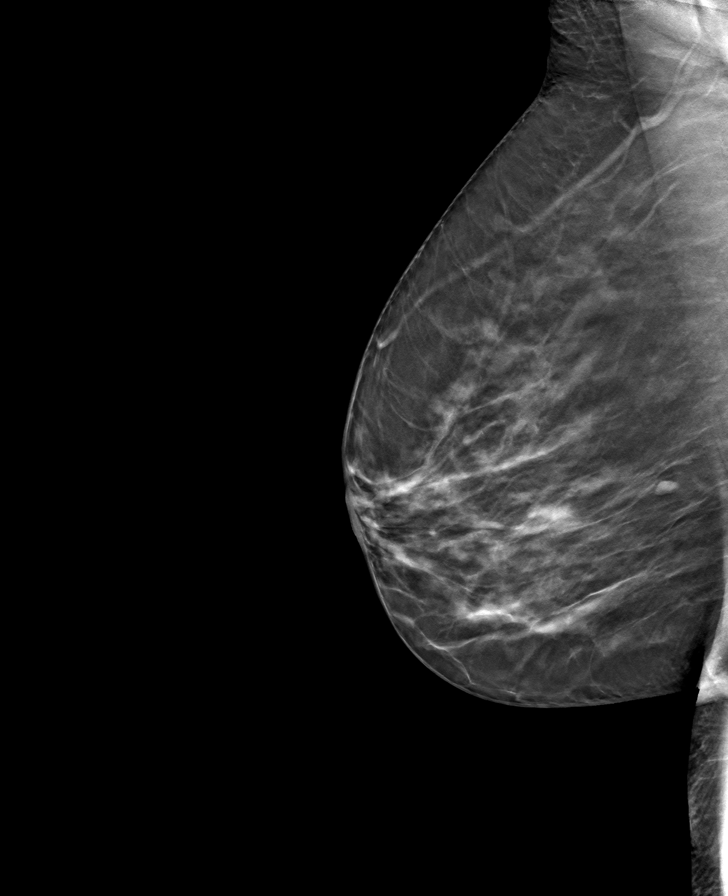

[R CC tomo · tomo slice 27/53.0]
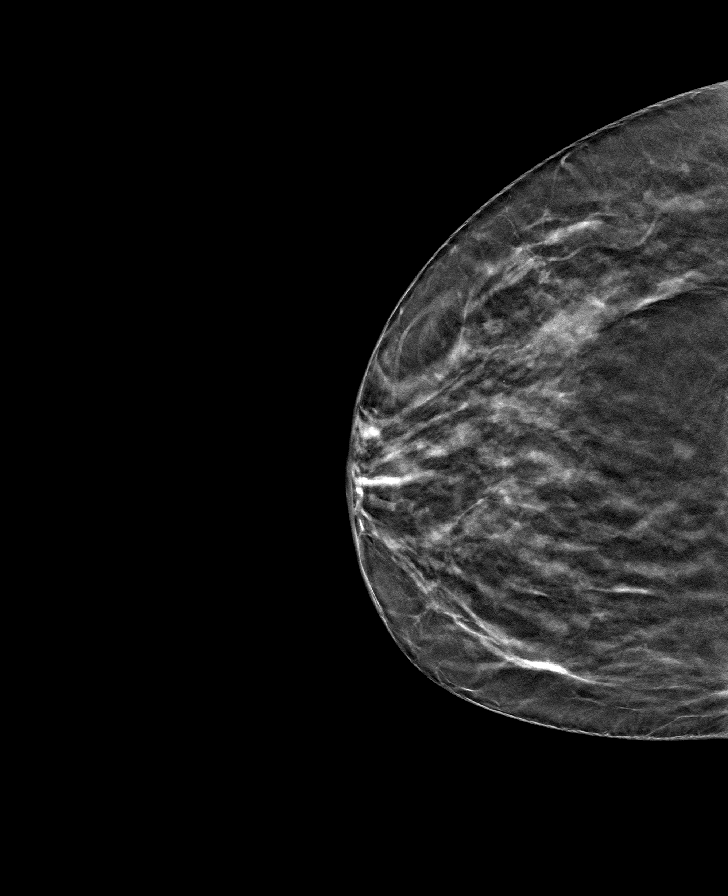

[L CC tomo · tomo slice 28/55.0]
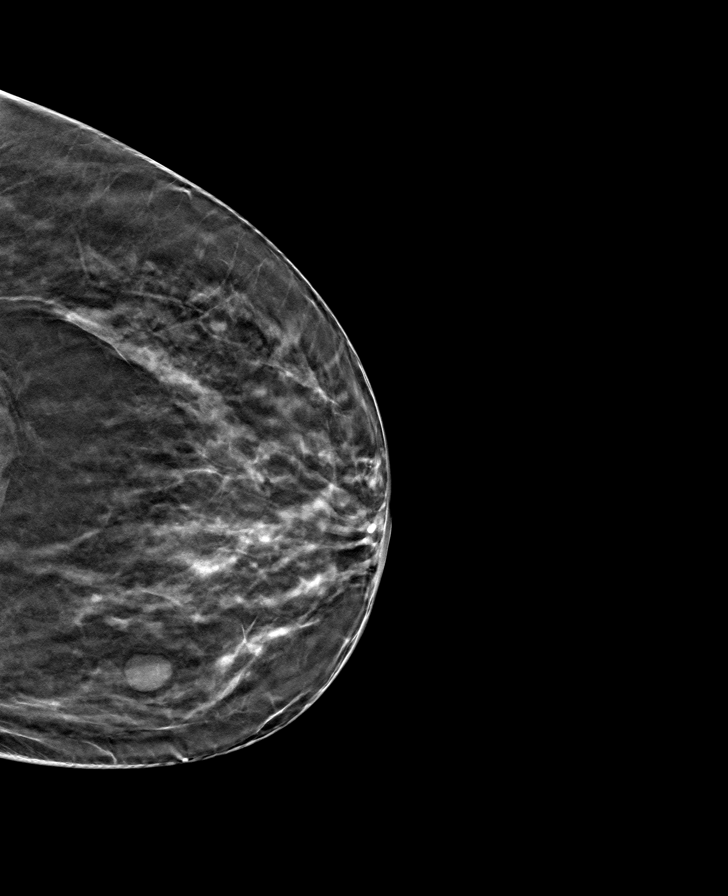

[L MLO tomo · tomo slice 30/59.0]
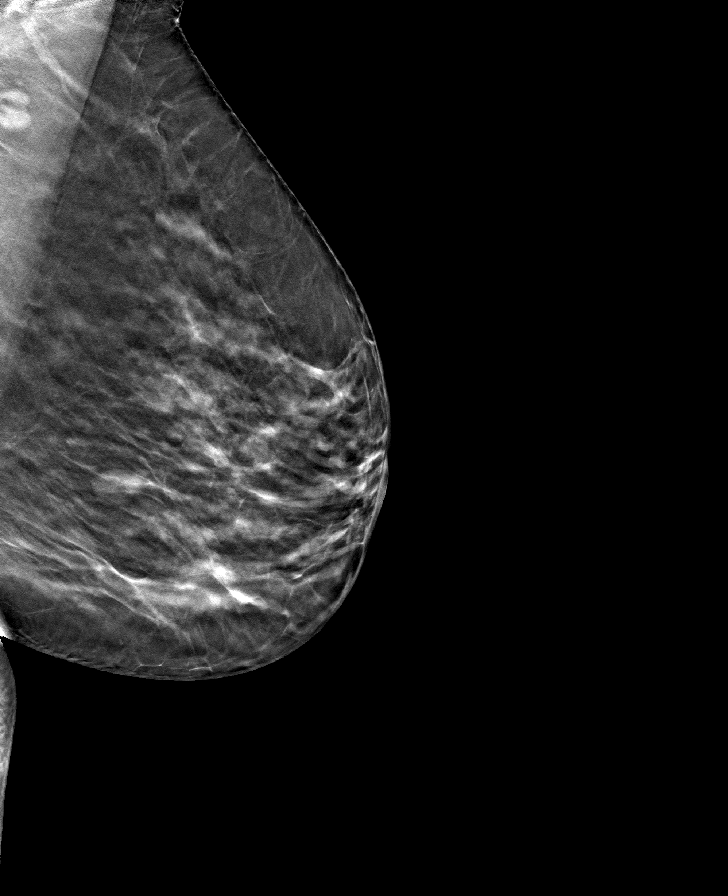

[8 of 24 positions shown; findings below may reference images not displayed]

ACR Breast Density Category b: There are scattered areas of
fibroglandular density.
FINDINGS: There are no findings suspicious for malignancy. Images were
processed with CAD.
IMPRESSION: No mammographic evidence of malignancy. A result letter of this
screening mammogram will be mailed directly to the patient.

RECOMMENDATION:
Screening mammogram in one year. (Code:[id number])

BI-RADS CATEGORY  1: Negative.

## 2018-07-23 ENCOUNTER — Ambulatory Visit: Payer: 59

## 2018-08-06 ENCOUNTER — Ambulatory Visit: Admission: RE | Admit: 2018-08-06 | Payer: 59 | Source: Ambulatory Visit

## 2018-08-17 ENCOUNTER — Other Ambulatory Visit: Payer: Self-pay | Admitting: Internal Medicine

## 2018-08-17 DIAGNOSIS — Z1231 Encounter for screening mammogram for malignant neoplasm of breast: Secondary | ICD-10-CM

## 2018-08-20 ENCOUNTER — Ambulatory Visit
Admission: RE | Admit: 2018-08-20 | Discharge: 2018-08-20 | Disposition: A | Discharge status: Home / Self Care | Payer: 59 | Source: Ambulatory Visit | Attending: Physical Medicine and Rehabilitation | Admitting: Physical Medicine and Rehabilitation

## 2018-08-20 ENCOUNTER — Other Ambulatory Visit: Payer: Self-pay

## 2018-08-20 DIAGNOSIS — G8929 Other chronic pain: Secondary | ICD-10-CM | POA: Insufficient documentation

## 2018-08-20 DIAGNOSIS — M5441 Lumbago with sciatica, right side: Secondary | ICD-10-CM | POA: Diagnosis present

## 2018-08-20 DIAGNOSIS — M5442 Lumbago with sciatica, left side: Secondary | ICD-10-CM | POA: Insufficient documentation

## 2018-09-02 ENCOUNTER — Other Ambulatory Visit: Payer: Self-pay | Admitting: Specialist

## 2018-09-02 ENCOUNTER — Other Ambulatory Visit (HOSPITAL_COMMUNITY): Payer: Self-pay | Admitting: Specialist

## 2018-09-02 DIAGNOSIS — R911 Solitary pulmonary nodule: Secondary | ICD-10-CM

## 2018-09-10 ENCOUNTER — Other Ambulatory Visit: Payer: Self-pay

## 2018-09-10 ENCOUNTER — Ambulatory Visit
Admission: RE | Admit: 2018-09-10 | Discharge: 2018-09-10 | Disposition: A | Discharge status: Home / Self Care | Payer: 59 | Source: Ambulatory Visit | Attending: Specialist | Admitting: Specialist

## 2018-09-10 DIAGNOSIS — R911 Solitary pulmonary nodule: Secondary | ICD-10-CM | POA: Insufficient documentation
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
## 2018-09-24 ENCOUNTER — Other Ambulatory Visit: Payer: Self-pay

## 2018-09-24 ENCOUNTER — Encounter: Payer: Self-pay | Admitting: Student in an Organized Health Care Education/Training Program

## 2018-09-24 ENCOUNTER — Ambulatory Visit (HOSPITAL_BASED_OUTPATIENT_CLINIC_OR_DEPARTMENT_OTHER): Payer: 59 | Admitting: Student in an Organized Health Care Education/Training Program

## 2018-09-24 VITALS — BP 124/82 | HR 78 | Temp 98.0°F | Resp 16 | Ht 63.0 in | Wt 175.0 lb

## 2018-09-24 DIAGNOSIS — Z1231 Encounter for screening mammogram for malignant neoplasm of breast: Secondary | ICD-10-CM | POA: Diagnosis not present

## 2018-09-24 DIAGNOSIS — M47816 Spondylosis without myelopathy or radiculopathy, lumbar region: Secondary | ICD-10-CM | POA: Diagnosis not present

## 2018-09-24 DIAGNOSIS — M5416 Radiculopathy, lumbar region: Secondary | ICD-10-CM

## 2018-09-24 DIAGNOSIS — G8929 Other chronic pain: Secondary | ICD-10-CM | POA: Insufficient documentation

## 2018-09-24 DIAGNOSIS — G894 Chronic pain syndrome: Secondary | ICD-10-CM | POA: Insufficient documentation

## 2018-09-24 DIAGNOSIS — M5136 Other intervertebral disc degeneration, lumbar region: Secondary | ICD-10-CM | POA: Diagnosis not present

## 2018-09-24 HISTORY — DX: Spondylosis without myelopathy or radiculopathy, lumbar region: M47.816

## 2018-09-24 NOTE — Patient Instructions (Signed)
____________________________________________________________________________________________  General Risks and Possible Complications  Patient Responsibilities: It is important that you read this as it is part of your informed consent. It is our duty to inform you of the risks and possible complications associated with treatments offered to you. It is your responsibility as a patient to read this and to ask questions about anything that is not clear or that you believe was not covered in this document.  Patient's Rights: You have the right to refuse treatment. You also have the right to change your mind, even after initially having agreed to have the treatment done. However, under this last option, if you wait until the last second to change your mind, you may be charged for the materials used up to that point.  Introduction: Medicine is not an exact science. Everything in Medicine, including the lack of treatment(s), carries the potential for danger, harm, or loss (which is by definition: Risk). In Medicine, a complication is a secondary problem, condition, or disease that can aggravate an already existing one. All treatments carry the risk of possible complications. The fact that a side effects or complications occurs, does not imply that the treatment was conducted incorrectly. It must be clearly understood that these can happen even when everything is done following the highest safety standards.  No treatment: You can choose not to proceed with the proposed treatment alternative. The "PRO(s)" would include: avoiding the risk of complications associated with the therapy. The "CON(s)" would include: not getting any of the treatment benefits. These benefits fall under one of three categories: diagnostic; therapeutic; and/or palliative. Diagnostic benefits include: getting information which can ultimately lead to improvement of the disease or symptom(s). Therapeutic benefits are those associated with the  successful treatment of the disease. Finally, palliative benefits are those related to the decrease of the primary symptoms, without necessarily curing the condition (example: decreasing the pain from a flare-up of a chronic condition, such as incurable terminal cancer).  General Risks and Complications: These are associated to most interventional treatments. They can occur alone, or in combination. They fall under one of the following six (6) categories: no benefit or worsening of symptoms; bleeding; infection; nerve damage; allergic reactions; and/or death. 1. No benefits or worsening of symptoms: In Medicine there are no guarantees, only probabilities. No healthcare provider can ever guarantee that a medical treatment will work, they can only state the probability that it may. Furthermore, there is always the possibility that the condition may worsen, either directly, or indirectly, as a consequence of the treatment. 2. Bleeding: This is more common if the patient is taking a blood thinner, either prescription or over the counter (example: Goody Powders, Fish oil, Aspirin, Garlic, etc.), or if suffering a condition associated with impaired coagulation (example: Hemophilia, cirrhosis of the liver, low platelet counts, etc.). However, even if you do not have one on these, it can still happen. If you have any of these conditions, or take one of these drugs, make sure to notify your treating physician. 3. Infection: This is more common in patients with a compromised immune system, either due to disease (example: diabetes, cancer, human immunodeficiency virus [HIV], etc.), or due to medications or treatments (example: therapies used to treat cancer and rheumatological diseases). However, even if you do not have one on these, it can still happen. If you have any of these conditions, or take one of these drugs, make sure to notify your treating physician. 4. Nerve Damage: This is more common when the   treatment is  an invasive one, but it can also happen with the use of medications, such as those used in the treatment of cancer. The damage can occur to small secondary nerves, or to large primary ones, such as those in the spinal cord and brain. This damage may be temporary or permanent and it may lead to impairments that can range from temporary numbness to permanent paralysis and/or brain death. 5. Allergic Reactions: Any time a substance or material comes in contact with our body, there is the possibility of an allergic reaction. These can range from a mild skin rash (contact dermatitis) to a severe systemic reaction (anaphylactic reaction), which can result in death. 6. Death: In general, any medical intervention can result in death, most of the time due to an unforeseen complication. ____________________________________________________________________________________________  ____________________________________________________________________________________________  Preparing for your procedure (without sedation)  Procedure appointments are limited to planned procedures: . No Prescription Refills. . No disability issues will be discussed. . No medication changes will be discussed.  Instructions: . Oral Intake: Do not eat or drink anything for at least 3 hours prior to your procedure. . Transportation: Unless otherwise stated by your physician, you may drive yourself after the procedure. . Blood Pressure Medicine: Take your blood pressure medicine with a sip of water the morning of the procedure. . Blood thinners: Notify our staff if you are taking any blood thinners. Depending on which one you take, there will be specific instructions on how and when to stop it. . Diabetics on insulin: Notify the staff so that you can be scheduled 1st case in the morning. If your diabetes requires high dose insulin, take only  of your normal insulin dose the morning of the procedure and notify the staff that you have  done so. . Preventing infections: Shower with an antibacterial soap the morning of your procedure.  . Build-up your immune system: Take 1000 mg of Vitamin C with every meal (3 times a day) the day prior to your procedure. Marland Kitchen Antibiotics: Inform the staff if you have a condition or reason that requires you to take antibiotics before dental procedures. . Pregnancy: If you are pregnant, call and cancel the procedure. . Sickness: If you have a cold, fever, or any active infections, call and cancel the procedure. . Arrival: You must be in the facility at least 30 minutes prior to your scheduled procedure. . Children: Do not bring any children with you. . Dress appropriately: Bring dark clothing that you would not mind if they get stained. . Valuables: Do not bring any jewelry or valuables.  Reasons to call and reschedule or cancel your procedure: (Following these recommendations will minimize the risk of a serious complication.) . Surgeries: Avoid having procedures within 2 weeks of any surgery. (Avoid for 2 weeks before or after any surgery). . Flu Shots: Avoid having procedures within 2 weeks of a flu shots or . (Avoid for 2 weeks before or after immunizations). . Barium: Avoid having a procedure within 7-10 days after having had a radiological study involving the use of radiological contrast. (Myelograms, Barium swallow or enema study). . Heart attacks: Avoid any elective procedures or surgeries for the initial 6 months after a "Myocardial Infarction" (Heart Attack). . Blood thinners: It is imperative that you stop these medications before procedures. Let us know if you if you take any blood thinner.  . Infection: Avoid procedures during or within two weeks of an infection (including chest colds or gastrointestinal problems). Symptoms associated with  infections include: Localized redness, fever, chills, night sweats or profuse sweating, burning sensation when voiding, cough, congestion, stuffiness,  runny nose, sore throat, diarrhea, nausea, vomiting, cold or Flu symptoms, recent or current infections. It is specially important if the infection is over the area that we intend to treat. Marland Kitchen. Heart and lung problems: Symptoms that may suggest an active cardiopulmonary problem include: cough, chest pain, breathing difficulties or shortness of breath, dizziness, ankle swelling, uncontrolled high or unusually low blood pressure, and/or palpitations. If you are experiencing any of these symptoms, cancel your procedure and contact your primary care physician for an evaluation.  Remember:  Regular Business hours are:  Monday to Thursday 8:00 AM to 4:00 PM  Provider's Schedule: Delano MetzFrancisco Naveira, MD:  Procedure days: Tuesday and Thursday 7:30 AM to 4:00 PM  Edward JollyBilal Lateef, MD:  Procedure days: Monday and Wednesday 7:30 AM to 4:00 PM ____________________________________________________________________________________________  Epidural Steroid Injection Patient Information  Description: The epidural space surrounds the nerves as they exit the spinal cord.  In some patients, the nerves can be compressed and inflamed by a bulging disc or a tight spinal canal (spinal stenosis).  By injecting steroids into the epidural space, we can bring irritated nerves into direct contact with a potentially helpful medication.  These steroids act directly on the irritated nerves and can reduce swelling and inflammation which often leads to decreased pain.  Epidural steroids may be injected anywhere along the spine and from the neck to the low back depending upon the location of your pain.   After numbing the skin with local anesthetic (like Novocaine), a small needle is passed into the epidural space slowly.  You may experience a sensation of pressure while this is being done.  The entire block usually last less than 10 minutes.  Conditions which may be treated by epidural steroids:   Low back and leg pain  Neck and  arm pain  Spinal stenosis  Post-laminectomy syndrome  Herpes zoster (shingles) pain  Pain from compression fractures  Preparation for the injection:  1. Do not eat any solid food or dairy products within 8 hours of your appointment.  2. You may drink clear liquids up to 3 hours before appointment.  Clear liquids include water, black coffee, juice or soda.  No milk or cream please. 3. You may take your regular medication, including pain medications, with a sip of water before your appointment  Diabetics should hold regular insulin (if taken separately) and take 1/2 normal NPH dos the morning of the procedure.  Carry some sugar containing items with you to your appointment. 4. A driver must accompany you and be prepared to drive you home after your procedure.  5. Bring all your current medications with your. 6. An IV may be inserted and sedation may be given at the discretion of the physician.   7. A blood pressure cuff, EKG and other monitors will often be applied during the procedure.  Some patients may need to have extra oxygen administered for a short period. 8. You will be asked to provide medical information, including your allergies, prior to the procedure.  We must know immediately if you are taking blood thinners (like Coumadin/Warfarin)  Or if you are allergic to IV iodine contrast (dye). We must know if you could possible be pregnant.  Possible side-effects:  Bleeding from needle site  Infection (rare, may require surgery)  Nerve injury (rare)  Numbness & tingling (temporary)  Difficulty urinating (rare, temporary)  Spinal headache (  a headache worse with upright posture)  Light -headedness (temporary)  Pain at injection site (several days)  Decreased blood pressure (temporary)  Weakness in arm/leg (temporary)  Pressure sensation in back/neck (temporary)  Call if you experience:  Fever/chills associated with headache or increased back/neck pain.  Headache  worsened by an upright position.  New onset weakness or numbness of an extremity below the injection site  Hives or difficulty breathing (go to the emergency room)  Inflammation or drainage at the infection site  Severe back/neck pain  Any new symptoms which are concerning to you  Please note:  Although the local anesthetic injected can often make your back or neck feel good for several hours after the injection, the pain will likely return.  It takes 3-7 days for steroids to work in the epidural space.  You may not notice any pain relief for at least that one week.  If effective, we will often do a series of three injections spaced 3-6 weeks apart to maximally decrease your pain.  After the initial series, we generally will wait several months before considering a repeat injection of the same type.  If you have any questions, please call [phone] [facility]

## 2018-09-24 NOTE — Progress Notes (Signed)
Safety precautions to be maintained throughout the outpatient stay will include: orient to surroundings, keep bed in low position, maintain call bell within reach at all times, provide assistance with transfer out of bed and ambulation.  

## 2018-09-25 ENCOUNTER — Ambulatory Visit
Admission: RE | Admit: 2018-09-25 | Discharge: 2018-09-25 | Disposition: A | Discharge status: Home / Self Care | Payer: 59 | Source: Ambulatory Visit | Attending: Internal Medicine | Admitting: Internal Medicine

## 2018-09-25 DIAGNOSIS — G8929 Other chronic pain: Secondary | ICD-10-CM | POA: Insufficient documentation

## 2018-09-25 DIAGNOSIS — M47816 Spondylosis without myelopathy or radiculopathy, lumbar region: Secondary | ICD-10-CM | POA: Insufficient documentation

## 2018-09-25 DIAGNOSIS — M5416 Radiculopathy, lumbar region: Secondary | ICD-10-CM | POA: Insufficient documentation

## 2018-09-25 DIAGNOSIS — Z1231 Encounter for screening mammogram for malignant neoplasm of breast: Secondary | ICD-10-CM | POA: Diagnosis not present

## 2018-09-25 DIAGNOSIS — G894 Chronic pain syndrome: Secondary | ICD-10-CM | POA: Insufficient documentation

## 2018-09-25 DIAGNOSIS — M5136 Other intervertebral disc degeneration, lumbar region: Secondary | ICD-10-CM | POA: Insufficient documentation

## 2018-10-02 ENCOUNTER — Other Ambulatory Visit: Payer: Self-pay

## 2018-10-02 ENCOUNTER — Other Ambulatory Visit
Admission: RE | Admit: 2018-10-02 | Discharge: 2018-10-02 | Disposition: A | Discharge status: Home / Self Care | Payer: 59 | Source: Ambulatory Visit | Attending: Student in an Organized Health Care Education/Training Program | Admitting: Student in an Organized Health Care Education/Training Program

## 2018-10-02 DIAGNOSIS — Z01812 Encounter for preprocedural laboratory examination: Secondary | ICD-10-CM | POA: Insufficient documentation

## 2018-10-02 DIAGNOSIS — Z1159 Encounter for screening for other viral diseases: Secondary | ICD-10-CM | POA: Diagnosis not present

## 2018-10-03 LAB — SARS CORONAVIRUS 2 (TAT 6-24 HRS): SARS Coronavirus 2: NEGATIVE

## 2018-10-07 ENCOUNTER — Other Ambulatory Visit: Payer: Self-pay

## 2018-10-07 ENCOUNTER — Ambulatory Visit (HOSPITAL_BASED_OUTPATIENT_CLINIC_OR_DEPARTMENT_OTHER): Payer: 59 | Admitting: Student in an Organized Health Care Education/Training Program

## 2018-10-07 ENCOUNTER — Encounter: Payer: Self-pay | Admitting: Student in an Organized Health Care Education/Training Program

## 2018-10-07 ENCOUNTER — Ambulatory Visit
Admission: RE | Admit: 2018-10-07 | Discharge: 2018-10-07 | Disposition: A | Discharge status: Home / Self Care | Payer: 59 | Source: Ambulatory Visit | Attending: Student in an Organized Health Care Education/Training Program | Admitting: Student in an Organized Health Care Education/Training Program

## 2018-10-07 DIAGNOSIS — M5416 Radiculopathy, lumbar region: Secondary | ICD-10-CM | POA: Insufficient documentation

## 2018-10-07 MED ORDER — ROPIVACAINE HCL 2 MG/ML IJ SOLN
1.0000 mL | Freq: Once | INTRAMUSCULAR | Status: AC
  Administered 2018-10-07: 2 mL via EPIDURAL
  Filled 2018-10-07: qty 10

## 2018-10-07 MED ORDER — FENTANYL CITRATE (PF) 100 MCG/2ML IJ SOLN
25.0000 ug | INTRAMUSCULAR | Status: DC | PRN
  Administered 2018-10-07: 50 ug via INTRAVENOUS
  Filled 2018-10-07: qty 2

## 2018-10-07 MED ORDER — SODIUM CHLORIDE (PF) 0.9 % IJ SOLN
INTRAMUSCULAR | Status: AC
  Filled 2018-10-07: qty 10

## 2018-10-07 MED ORDER — IOHEXOL 180 MG/ML  SOLN
10.0000 mL | Freq: Once | INTRAMUSCULAR | Status: AC
  Administered 2018-10-07: 10 mL via EPIDURAL

## 2018-10-07 MED ORDER — DEXAMETHASONE SODIUM PHOSPHATE 10 MG/ML IJ SOLN
10.0000 mg | Freq: Once | INTRAMUSCULAR | Status: AC
  Administered 2018-10-07: 10 mg
  Filled 2018-10-07: qty 1

## 2018-10-07 MED ORDER — SODIUM CHLORIDE 0.9% FLUSH
1.0000 mL | Freq: Once | INTRAVENOUS | Status: AC
  Administered 2018-10-07: 10:00:00

## 2018-10-07 MED ORDER — LIDOCAINE HCL 2 % IJ SOLN
20.0000 mL | Freq: Once | INTRAMUSCULAR | Status: AC
  Administered 2018-10-07: 400 mg
  Filled 2018-10-07: qty 20

## 2018-10-07 NOTE — Patient Instructions (Signed)

## 2018-10-07 NOTE — Progress Notes (Signed)
[name] patient wants to come earlier as long as it is like patient's Name: Abigail Huffman  MRN: [id number]  Referring Provider: Edward JollyLateef, Dianara Smullen, MD  DOB: 1960-09-18  PCP: Patient, No Pcp Per  DOS: 10/07/2018  Note by: Edward JollyBilal Rebbecca Osuna, MD  Service setting: Ambulatory outpatient  Specialty: Interventional Pain Management  Patient type: Established  Location: [facility] (AMB) Pain Management Facility  Visit type: Interventional Procedure   Primary Reason for Visit: Interventional Pain Management Treatment. CC: Back Pain (lower)  Procedure:          Anesthesia, Analgesia, Anxiolysis:  Type: Diagnostic Inter-Laminar Epidural Steroid Injection  #1  Region: Lumbar Level: L4-5 Level. Laterality: Midline         Type: Moderate (Conscious) Sedation combined with Local Anesthesia  IV Fentanyl with 1-2% Lidocaine  Position: Prone with head of the table was raised to facilitate breathing.   Indications: 1. Lumbar radiculopathy    Pain Score: Pre-procedure: 7 /10 Post-procedure: 0-No pain/10  Pre-op Assessment:  Abigail Huffman is a 58 y.o. (year old), female patient, seen today for interventional treatment. She  has a past surgical history that includes Cholecystectomy; Abdominal hysterectomy; and Breast biopsy (Right, 2006). Abigail Huffman has a current medication list which includes the following prescription(s): albuterol, beclomethasone, cetirizine, esomeprazole, ferrous sulfate, hydrochlorothiazide, hydrocodone-acetaminophen, paroxetine mesylate, prednisone, tizanidine, tramadol, gabapentin, and metoprolol succinate, and the following Facility-Administered Medications: fentanyl. Her primarily concern today is the Back Pain (lower)  Initial Vital Signs:  Pulse/HCG Rate: 100ECG Heart Rate: (!) 138 Temp: 98.3 F (36.8 C) Resp: 16 BP: 137/89 SpO2: 100 %  BMI: Estimated body mass index is 35.15 kg/m as calculated from the following:   Height as of this encounter: 5' (1.524 m).   Weight as of  this encounter: 180 lb (81.6 kg).  Risk Assessment: Allergies: Reviewed. She is allergic to gabapentin; pregabalin; and aspirin.  Allergy Precautions: None required Coagulopathies: Reviewed. None identified.  Blood-thinner therapy: None at this time Active Infection(s): Reviewed. None identified. Abigail Huffman is afebrile  Site Confirmation: Abigail Huffman was asked to confirm the procedure and laterality before marking the site Procedure checklist: Completed Consent: Before the procedure and under the influence of no sedative(s), amnesic(s), or anxiolytics, the patient was informed of the treatment options, risks and possible complications. To fulfill our ethical and legal obligations, as recommended by the American Medical Association's Code of Ethics, I have informed the patient of my clinical impression; the nature and purpose of the treatment or procedure; the risks, benefits, and possible complications of the intervention; the alternatives, including doing nothing; the risk(s) and benefit(s) of the alternative treatment(s) or procedure(s); and the risk(s) and benefit(s) of doing nothing. The patient was provided information about the general risks and possible complications associated with the procedure. These may include, but are not limited to: failure to achieve desired goals, infection, bleeding, organ or nerve damage, allergic reactions, paralysis, and death. In addition, the patient was informed of those risks and complications associated to Spine-related procedures, such as failure to decrease pain; infection (i.e.: Meningitis, epidural or intraspinal abscess); bleeding (i.e.: epidural hematoma, subarachnoid hemorrhage, or any other type of intraspinal or peri-dural bleeding); organ or nerve damage (i.e.: Any type of peripheral nerve, nerve root, or spinal cord injury) with subsequent damage to sensory, motor, and/or autonomic systems, resulting in permanent pain, numbness, and/or weakness of one or  several areas of the body; allergic reactions; (i.e.: anaphylactic reaction); and/or death. Furthermore, the patient was informed of those risks  and complications associated with the medications. These include, but are not limited to: allergic reactions (i.e.: anaphylactic or anaphylactoid reaction(s)); adrenal axis suppression; blood sugar elevation that in diabetics may result in ketoacidosis or comma; water retention that in patients with history of congestive heart failure may result in shortness of breath, pulmonary edema, and decompensation with resultant heart failure; weight gain; swelling or edema; medication-induced neural toxicity; particulate matter embolism and blood vessel occlusion with resultant organ, and/or nervous system infarction; and/or aseptic necrosis of one or more joints. Finally, the patient was informed that Medicine is not an exact science; therefore, there is also the possibility of unforeseen or unpredictable risks and/or possible complications that may result in a catastrophic outcome. The patient indicated having understood very clearly. We have given the patient no guarantees and we have made no promises. Enough time was given to the patient to ask questions, all of which were answered to the patient's satisfaction. Ms. Abigail Huffman has indicated that she wanted to continue with the procedure. Attestation: I, the ordering provider, attest that I have discussed with the patient the benefits, risks, side-effects, alternatives, likelihood of achieving goals, and potential problems during recovery for the procedure that I have provided informed consent. Date  Time: 10/07/2018  8:25 AM  Pre-Procedure Preparation:  Monitoring: As per clinic protocol. Respiration, ETCO2, SpO2, BP, heart rate and rhythm monitor placed and checked for adequate function Safety Precautions: Patient was assessed for positional comfort and pressure points before starting the procedure. Time-out: I initiated and  conducted the "Time-out" before starting the procedure, as per protocol. The patient was asked to participate by confirming the accuracy of the "Time Out" information. Verification of the correct person, site, and procedure were performed and confirmed by me, the nursing staff, and the patient. "Time-out" conducted as per Joint Commission's Universal Protocol (UP.01.01.01). Time: 0910  Description of Procedure:          Target Area: The interlaminar space, initially targeting the lower laminar border of the superior vertebral body. Approach: Paramedial approach. Area Prepped: Entire Posterior Lumbar Region Prepping solution: DuraPrep (Iodine Povacrylex [0.7% available iodine] and Isopropyl Alcohol, 74% w/w) Safety Precautions: Aspiration looking for blood return was conducted prior to all injections. At no point did we inject any substances, as a needle was being advanced. No attempts were made at seeking any paresthesias. Safe injection practices and needle disposal techniques used. Medications properly checked for expiration dates. SDV (single dose vial) medications used. Description of the Procedure: Protocol guidelines were followed. The procedure needle was introduced through the skin, ipsilateral to the reported pain, and advanced to the target area. Bone was contacted and the needle walked caudad, until the lamina was cleared. The epidural space was identified using "loss-of-resistance technique" with 2-3 ml of PF-NaCl (0.9% NSS), in a 5cc LOR glass syringe.  Vitals:   10/07/18 0925 10/07/18 0931 10/07/18 0941 10/07/18 0951  BP: (!) 145/87 136/84 132/81 (!) 126/93  Pulse:      Resp: 10 13 (!) 25 (!) 21  Temp:  97.8 F (36.6 C)  98.1 F (36.7 C)  TempSrc:  Temporal  Temporal  SpO2: 100% 100% 100% 100%  Weight:      Height:        Start Time: 0910 hrs. End Time: 0924 hrs.  Materials:  Needle(s) Type: Epidural needle Gauge: 17G Length: 3.5-in Medication(s): Please see orders for  medications and dosing details. 8 cc solution made of 5 cc of preservative-free saline, 2 cc of 0.2%  ropivacaine, 1 cc of Decadron 10 mg/cc. Imaging Guidance (Spinal):          Type of Imaging Technique: Fluoroscopy Guidance (Spinal) Indication(s): Assistance in needle guidance and placement for procedures requiring needle placement in or near specific anatomical locations not easily accessible without such assistance. Exposure Time: Please see nurses notes. Contrast: Before injecting any contrast, we confirmed that the patient did not have an allergy to iodine, shellfish, or radiological contrast. Once satisfactory needle placement was completed at the desired level, radiological contrast was injected. Contrast injected under live fluoroscopy. No contrast complications. See chart for type and volume of contrast used. Fluoroscopic Guidance: I was personally present during the use of fluoroscopy. "Tunnel Vision Technique" used to obtain the best possible view of the target area. Parallax error corrected before commencing the procedure. "Direction-depth-direction" technique used to introduce the needle under continuous pulsed fluoroscopy. Once target was reached, antero-posterior, oblique, and lateral fluoroscopic projection used confirm needle placement in all planes. Images permanently stored in EMR. Interpretation: I personally interpreted the imaging intraoperatively. Adequate needle placement confirmed in multiple planes. Appropriate spread of contrast into desired area was observed. No evidence of afferent or efferent intravascular uptake. No intrathecal or subarachnoid spread observed. Permanent images saved into the patient's record.  Antibiotic Prophylaxis:   Anti-infectives (From admission, onward)   None     Indication(s): None identified  Post-operative Assessment:  Post-procedure Vital Signs:  Pulse/HCG Rate: 10083 Temp: 98.1 F (36.7 C) Resp: (!) 21 BP: (!) 126/93 SpO2: 100  %  EBL: None  Complications: No immediate post-treatment complications observed by team, or reported by patient.  Note: The patient tolerated the entire procedure well. A repeat set of vitals were taken after the procedure and the patient was kept under observation following institutional policy, for this type of procedure. Post-procedural neurological assessment was performed, showing return to baseline, prior to discharge. The patient was provided with post-procedure discharge instructions, including a section on how to identify potential problems. Should any problems arise concerning this procedure, the patient was given instructions to immediately contact us, at any time, without hesitation. In any case, we plan to contact the patient by telephone for a follow-up status report regarding this interventional procedure.  Comments:  No additional relevant information.  Plan of Care  Orders:  Orders Placed This Encounter  Procedures  . DG PAIN CLINIC C-ARM 1-60 MIN NO REPORT    Intraoperative interpretation by procedural physician at [facility].    Standing Status:   Standing    Number of Occurrences:   1    Order Specific Question:   Reason for exam:    Answer:   Assistance in needle guidance and placement for procedures requiring needle placement in or near specific anatomical locations not easily accessible without such assistance.   Medications ordered for procedure: Meds ordered this encounter  Medications  . iohexol (OMNIPAQUE) 180 MG/ML injection 10 mL    Must be Myelogram-compatible. If not available, you may substitute with a water-soluble, non-ionic, hypoallergenic, myelogram-compatible radiological contrast medium.  Marland Kitchen lidocaine (XYLOCAINE) 2 % (with pres) injection 400 mg  . sodium chloride flush (NS) 0.9 % injection 1 mL  . ropivacaine (PF) 2 mg/mL (0.2%) (NAROPIN) injection 1 mL  . dexamethasone (DECADRON) injection 10 mg  . fentaNYL (SUBLIMAZE) injection 25-50  mcg    Make sure Narcan is available in the pyxis when using this medication. In the event of respiratory depression (RR< 8/min): Titrate NARCAN (naloxone) in increments of  0.1 to 0.2 mg IV at 2-3 minute intervals, until desired degree of reversal.   Medications administered: We administered iohexol, lidocaine, sodium chloride flush, ropivacaine (PF) 2 mg/mL (0.2%), dexamethasone, and fentaNYL.  See the medical record for exact dosing, route, and time of administration.  Follow-up plan:   Return in about 5 weeks (around 11/11/2018) for Post Procedure Evaluation, virtual.      Repeat lumbar ESI, diagnostic lumbar facet blocks   Recent Visits Date Type Provider Dept  09/24/18 Office Visit Edward JollyLateef, Gilbert Narain, MD [facility]  Showing recent visits within past 90 days and meeting all other requirements   Today's Visits Date Type Provider Dept  10/07/18 Procedure visit Edward JollyLateef, Corayma Cashatt, MD [facility]  Showing today's visits and meeting all other requirements   Future Appointments Date Type Provider Dept  10/26/18 Appointment Edward JollyLateef, Zuzanna Maroney, MD [facility]  Showing future appointments within next 90 days and meeting all other requirements   Disposition: Discharge home  Discharge Date & Time: 10/07/2018; 0953 hrs.   Primary Care Physician: Patient, No Pcp Per Location: [facility] Note by: Edward JollyBilal Decoda Van, MD Date: 10/07/2018; Time: 10:21 AM  Disclaimer:  Medicine is not an exact science. The only guarantee in medicine is that nothing is guaranteed. It is important to note that the decision to proceed with this intervention was based on the information collected from the patient. The Data and conclusions were drawn from the patient's questionnaire, the interview, and the physical examination. Because the information was provided in large part by the patient, it cannot be guaranteed that it has not been purposely or unconsciously manipulated. Every  effort has been made to obtain as much relevant data as possible for this evaluation. It is important to note that the conclusions that lead to this procedure are derived in large part from the available data. Always take into account that the treatment will also be dependent on availability of resources and existing treatment guidelines, considered by other Pain Management Practitioners as being common knowledge and practice, at the time of the intervention. For Medico-Legal purposes, it is also important to point out that variation in procedural techniques and pharmacological choices are the acceptable norm. The indications, contraindications, technique, and results of the above procedure should only be interpreted and judged by a Board-Certified Interventional Pain Specialist with extensive familiarity and expertise in the same exact procedure and technique.

## 2018-10-08 ENCOUNTER — Telehealth: Payer: Self-pay

## 2018-10-08 NOTE — Telephone Encounter (Signed)
Patient was called and no problems reported. 

## 2018-10-22 ENCOUNTER — Encounter: Payer: Self-pay | Admitting: Student in an Organized Health Care Education/Training Program

## 2018-10-26 ENCOUNTER — Ambulatory Visit
Payer: 59 | Attending: Student in an Organized Health Care Education/Training Program | Admitting: Student in an Organized Health Care Education/Training Program

## 2018-10-26 ENCOUNTER — Other Ambulatory Visit: Payer: Self-pay

## 2018-10-26 DIAGNOSIS — M47816 Spondylosis without myelopathy or radiculopathy, lumbar region: Secondary | ICD-10-CM | POA: Diagnosis not present

## 2018-10-26 DIAGNOSIS — M5416 Radiculopathy, lumbar region: Secondary | ICD-10-CM | POA: Diagnosis not present

## 2018-10-26 DIAGNOSIS — G8929 Other chronic pain: Secondary | ICD-10-CM | POA: Diagnosis not present

## 2018-10-26 MED ORDER — METHYLPREDNISOLONE 4 MG PO TBPK: ORAL_TABLET | ORAL | 0 refills | Status: AC

## 2018-10-26 NOTE — Progress Notes (Signed)
Pain Management Virtual Encounter Note - Virtual Visit via Telephone Telehealth (real-time audio visits between healthcare provider and patient).   Patient's Phone No. & Preferred Pharmacy:  [phone] (home); [phone] (mobile); (Preferred) [phone] [email or web address]  [facility] [address] ([address]), [place] - [address] [place]) [place] [address] Phone: [phone] Fax: [phone]    Pre-screening note:  Our staff contacted Abigail Huffman and offered Abigail Huffman an "in person", "face-to-face" appointment versus a telephone encounter. She indicated preferring the telephone encounter, at this time.   Reason for Virtual Visit: COVID-19*  Social distancing based on CDC and AMA recommendations.   I contacted Abigail Huffman on 10/26/2018 via telephone.      I clearly identified myself as Edward Jolly, MD. I verified that I was speaking with the correct person using two identifiers (Name: Abigail Huffman, and date of birth: October 21, 1960).  Advanced Informed Consent I sought verbal advanced consent from Abigail Huffman for virtual visit interactions. I informed Abigail Huffman of possible security and privacy concerns, risks, and limitations associated with providing "not-in-person" medical evaluation and management services. I also informed Abigail Huffman of the availability of "in-person" appointments. Finally, I informed Abigail Huffman that there would be a charge for the virtual visit and that she could be  personally, fully or partially, financially responsible for it. Abigail Huffman expressed understanding and agreed to proceed.   Historic Elements   Abigail Huffman is a 58 y.o. year old, female patient evaluated today after Abigail Huffman last encounter by our practice on 10/08/2018. Abigail Huffman  has a past medical history of Arthritis, Asthma, Diabetes (HCC), GERD (gastroesophageal reflux disease), Heart murmur, Hypertension, and Ulcer of gastric fundus. She also  has a past surgical  history that includes Cholecystectomy; Abdominal hysterectomy; and Breast biopsy (Right, 2006). Abigail Huffman has a current medication list which includes the following prescription(s): albuterol, beclomethasone, cetirizine, esomeprazole, ferrous sulfate, hydrochlorothiazide, hydrocodone-acetaminophen, paroxetine mesylate, tizanidine, tramadol, gabapentin, methylprednisolone, metoprolol succinate, and prednisone. She  reports that she has never smoked. She has never used smokeless tobacco. She reports that she does not drink alcohol or use drugs. Abigail Huffman is allergic to gabapentin; pregabalin; and aspirin.   HPI  Today, she is being contacted for a post-procedure assessment.  Evaluation of last interventional procedure  10/07/2018 Procedure: L4-L5 ESI #1 Pre-procedure pain score:  7/10 Post-procedure pain score: 0/10         Influential Factors: Intra-procedural challenges: None observed.         Reported side-effects: None.        Post-procedural adverse reactions or complications: None reported         Sedation: Please see nurses note for DOS. When no sedatives are used, the analgesic levels obtained are directly associated to the effectiveness of the local anesthetics. However, when sedation is provided, the level of analgesia obtained during the initial 1 hour following the intervention, is believed to be the result of a combination of factors. These factors may include, but are not limited to: 1. The effectiveness of the local anesthetics used. 2. The effects of the analgesic(s) and/or anxiolytic(s) used. 3. The degree of discomfort experienced by the patient at the time of the procedure. 4. The patients ability and reliability in recalling and recording the events. 5. The presence and influence of possible secondary gains and/or psychosocial factors. Reported result: Relief experienced during the 1st hour after the procedure:  100% (Ultra-Short Term Relief)  Interpretative annotation:  Clinically appropriate result. Analgesia during this period is likely to be Local Anesthetic and/or IV Sedative (Analgesic/Anxiolytic) related.          Effects of local anesthetic: The analgesic effects attained during this period are directly associated to the localized infiltration of local anesthetics and therefore cary significant diagnostic value as to the etiological location, or anatomical origin, of the pain. Expected duration of relief is directly dependent on the pharmacodynamics of the local anesthetic used. Long-acting (4-6 hours) anesthetics used.  Reported result: Relief during the next 4 to 6 hour after the procedure:  25% (Short-Term Relief)            Interpretative annotation: Clinically appropriate result. Analgesia during this period is likely to be Local Anesthetic-related.          Long-term benefit: Defined as the period of time past the expected duration of local anesthetics (1 hour for short-acting and 4-6 hours for long-acting). With the possible exception of prolonged sympathetic blockade from the local anesthetics, benefits during this period are typically attributed to, or associated with, other factors such as analgesic sensory neuropraxia, antiinflammatory effects, or beneficial biochemical changes provided by agents other than the local anesthetics.  Reported result: Extended relief following procedure:  0% (Long-Term Relief)            Interpretative annotation: Unexpected result. No benefit. Therapeutic failure. Pain appears to be refractory to this treatment modality.          Pertinent Labs   SAFETY SCREENING Profile Lab Results  Component Value Date   SARSCOV2NAA NEGATIVE 10/02/2018   Renal Function Lab Results  Component Value Date   BUN 11 01/05/2018   CREATININE 0.87 01/05/2018   BCR 16 09/30/2016   GFRAA >60 01/05/2018   GFRNONAA >60 01/05/2018   Hepatic Function Lab Results  Component Value Date   AST 14 (L) 01/05/2018   ALT 19 01/05/2018    ALBUMIN 4.2 01/05/2018   UDS No results found for: SUMMARY Note: Above Lab results reviewed.   Assessment  The primary encounter diagnosis was Lumbar radiculopathy. Diagnoses of Chronic radicular lumbar pain and Lumbar facet arthropathy were also pertinent to this visit.  Plan of Care  I am having Abigail Huffman start on methylPREDNISolone. I am also having Abigail Huffman maintain Abigail Huffman HYDROcodone-acetaminophen, albuterol, beclomethasone, cetirizine, esomeprazole, ferrous sulfate, metoprolol succinate, PARoxetine Mesylate, traMADol, gabapentin, tiZANidine, predniSONE, and hydrochlorothiazide.  Pharmacotherapy (Medications Ordered): Meds ordered this encounter  Medications  . methylPREDNISolone (MEDROL) 4 MG TBPK tablet    Sig: Follow package instructions.    Dispense:  21 tablet    Refill:  0    Do not add to the "Automatic Refill" notification system.   Orders:  No orders of the defined types were placed in this encounter.  Follow-up plan:   Return if symptoms worsen or fail to improve.     Repeat lumbar ESI?, diagnostic lumbar facet blocks    Recent Visits Date Type Provider Dept  10/07/18 Procedure visit Edward JollyLateef, Audrianna Driskill, MD [facility]  09/24/18 Office Visit Edward JollyLateef, Coty Student, MD [facility]  Showing recent visits within past 90 days and meeting all other requirements   Today's Visits Date Type Provider Dept  10/26/18 Office Visit Edward JollyLateef, Tima Curet, MD [facility]  Showing today's visits and meeting all other requirements   Future Appointments No visits were found meeting these conditions.  Showing future appointments within next 90 days and meeting all other requirements   I  discussed the assessment and treatment plan with the patient. The patient was provided an opportunity to ask questions and all were answered. The patient agreed with the plan and demonstrated an understanding of the instructions.  Patient advised to call back or seek an in-person  evaluation if the symptoms or condition worsens.  Total duration of non-face-to-face encounter: 86minutes.  Note by: Gillis Santa, MD Date: 10/26/2018; Time: 1:25 PM  Note: This dictation was prepared with Dragon dictation. Any transcriptional errors that may result from this process are unintentional.  Disclaimer:  * Given the special circumstances of the COVID-19 pandemic, the federal government has announced that the Office for Civil Rights (OCR) will exercise its enforcement discretion and will not impose penalties on physicians using telehealth in the event of noncompliance with regulatory requirements under the [place] (HIPAA) in connection with the good faith provision of telehealth during the QXIHW-38 national public health emergency. ([place])

## 2019-07-19 ENCOUNTER — Other Ambulatory Visit: Payer: Self-pay | Admitting: Infectious Diseases

## 2019-07-20 ENCOUNTER — Other Ambulatory Visit: Payer: Self-pay | Admitting: Infectious Diseases

## 2019-07-20 DIAGNOSIS — N644 Mastodynia: Secondary | ICD-10-CM

## 2019-07-21 ENCOUNTER — Other Ambulatory Visit: Payer: Self-pay | Admitting: Infectious Diseases

## 2019-07-21 DIAGNOSIS — N644 Mastodynia: Secondary | ICD-10-CM

## 2019-07-30 ENCOUNTER — Ambulatory Visit
Admission: RE | Admit: 2019-07-30 | Discharge: 2019-07-30 | Disposition: A | Discharge status: Home / Self Care | Payer: 59 | Source: Ambulatory Visit | Attending: Infectious Diseases | Admitting: Infectious Diseases

## 2019-07-30 DIAGNOSIS — N644 Mastodynia: Secondary | ICD-10-CM | POA: Diagnosis present

## 2019-08-20 ENCOUNTER — Other Ambulatory Visit: Payer: Self-pay | Admitting: Infectious Diseases

## 2019-08-20 DIAGNOSIS — Z1231 Encounter for screening mammogram for malignant neoplasm of breast: Secondary | ICD-10-CM

## 2019-09-29 ENCOUNTER — Ambulatory Visit
Admission: RE | Admit: 2019-09-29 | Discharge: 2019-09-29 | Disposition: A | Discharge status: Home / Self Care | Payer: 59 | Source: Ambulatory Visit | Attending: Infectious Diseases | Admitting: Infectious Diseases

## 2019-09-29 DIAGNOSIS — Z1231 Encounter for screening mammogram for malignant neoplasm of breast: Secondary | ICD-10-CM | POA: Insufficient documentation

## 2020-07-10 ENCOUNTER — Other Ambulatory Visit: Payer: Self-pay | Admitting: Infectious Diseases

## 2020-07-10 DIAGNOSIS — Z1231 Encounter for screening mammogram for malignant neoplasm of breast: Secondary | ICD-10-CM

## 2020-07-31 ENCOUNTER — Other Ambulatory Visit: Payer: Self-pay | Admitting: Family Medicine

## 2020-07-31 DIAGNOSIS — M549 Dorsalgia, unspecified: Secondary | ICD-10-CM

## 2020-07-31 DIAGNOSIS — R202 Paresthesia of skin: Secondary | ICD-10-CM

## 2020-07-31 DIAGNOSIS — M5430 Sciatica, unspecified side: Secondary | ICD-10-CM

## 2020-07-31 DIAGNOSIS — R200 Anesthesia of skin: Secondary | ICD-10-CM

## 2020-08-04 ENCOUNTER — Ambulatory Visit: Payer: 59

## 2020-08-06 ENCOUNTER — Ambulatory Visit: Payer: 59

## 2020-08-06 ENCOUNTER — Ambulatory Visit
Admission: RE | Admit: 2020-08-06 | Discharge: 2020-08-06 | Disposition: A | Discharge status: Home / Self Care | Payer: 59 | Source: Ambulatory Visit | Attending: Family Medicine | Admitting: Family Medicine

## 2020-08-06 DIAGNOSIS — M549 Dorsalgia, unspecified: Secondary | ICD-10-CM | POA: Diagnosis present

## 2020-08-06 DIAGNOSIS — M5430 Sciatica, unspecified side: Secondary | ICD-10-CM | POA: Insufficient documentation

## 2020-08-06 DIAGNOSIS — R202 Paresthesia of skin: Secondary | ICD-10-CM | POA: Diagnosis present

## 2020-08-06 DIAGNOSIS — R200 Anesthesia of skin: Secondary | ICD-10-CM | POA: Insufficient documentation

## 2020-10-12 ENCOUNTER — Ambulatory Visit
Admission: RE | Admit: 2020-10-12 | Discharge: 2020-10-12 | Disposition: A | Discharge status: Home / Self Care | Payer: 59 | Source: Ambulatory Visit | Attending: Infectious Diseases | Admitting: Infectious Diseases

## 2020-10-12 ENCOUNTER — Other Ambulatory Visit: Payer: Self-pay

## 2020-10-12 DIAGNOSIS — Z1231 Encounter for screening mammogram for malignant neoplasm of breast: Secondary | ICD-10-CM | POA: Diagnosis not present

## 2020-10-24 ENCOUNTER — Other Ambulatory Visit (HOSPITAL_COMMUNITY): Payer: Self-pay | Admitting: Neurosurgery

## 2020-10-24 ENCOUNTER — Other Ambulatory Visit: Payer: Self-pay | Admitting: Neurosurgery

## 2020-10-24 DIAGNOSIS — G935 Compression of brain: Secondary | ICD-10-CM

## 2020-11-01 ENCOUNTER — Ambulatory Visit
Admission: RE | Admit: 2020-11-01 | Discharge: 2020-11-01 | Disposition: A | Discharge status: Home / Self Care | Payer: 59 | Source: Ambulatory Visit | Attending: Neurosurgery | Admitting: Neurosurgery

## 2020-11-01 ENCOUNTER — Other Ambulatory Visit: Payer: Self-pay

## 2020-11-01 DIAGNOSIS — G935 Compression of brain: Secondary | ICD-10-CM | POA: Diagnosis present

## 2020-11-15 ENCOUNTER — Other Ambulatory Visit: Payer: Self-pay | Admitting: Neurosurgery

## 2020-11-29 ENCOUNTER — Encounter
Admission: RE | Admit: 2020-11-29 | Discharge: 2020-11-29 | Disposition: A | Discharge status: Home / Self Care | Payer: 59 | Source: Ambulatory Visit | Attending: Neurosurgery | Admitting: Neurosurgery

## 2020-11-29 ENCOUNTER — Other Ambulatory Visit: Payer: Self-pay

## 2020-11-29 DIAGNOSIS — Z01818 Encounter for other preprocedural examination: Secondary | ICD-10-CM | POA: Insufficient documentation

## 2020-11-29 DIAGNOSIS — I10 Essential (primary) hypertension: Secondary | ICD-10-CM | POA: Diagnosis not present

## 2020-11-29 HISTORY — DX: Personal history of urinary calculi: Z87.442

## 2020-11-29 HISTORY — DX: Anemia, unspecified: D64.9

## 2020-11-29 HISTORY — DX: Prediabetes: R73.03

## 2020-11-29 LAB — PROTIME-INR
INR: 0.9 (ref 0.8–1.2)
Prothrombin Time: 12.2 seconds (ref 11.4–15.2)

## 2020-11-29 LAB — BASIC METABOLIC PANEL
Anion gap: 8 (ref 5–15)
BUN: 12 mg/dL (ref 6–20)
CO2: 25 mmol/L (ref 22–32)
Calcium: 9.5 mg/dL (ref 8.9–10.3)
Chloride: 110 mmol/L (ref 98–111)
Creatinine, Ser: 0.71 mg/dL (ref 0.44–1.00)
GFR, Estimated: >60 mL/min (ref >60)
Glucose, Bld: 97 mg/dL (ref 70–99)
Potassium: 3.9 mmol/L (ref 3.5–5.1)
Sodium: 143 mmol/L (ref 135–145)

## 2020-11-29 LAB — TYPE AND SCREEN
ABO/RH(D): B POS
Antibody Screen: NEGATIVE

## 2020-11-29 LAB — APTT: aPTT: 27 seconds (ref 24–36)

## 2020-11-29 LAB — SURGICAL PCR SCREEN
MRSA, PCR: NEGATIVE
Staphylococcus aureus: NEGATIVE

## 2020-11-29 LAB — CBC
HCT: 38.4 % (ref 36.0–46.0)
Hemoglobin: 12.4 g/dL (ref 12.0–15.0)
MCH: 29 pg (ref 26.0–34.0)
MCHC: 32.3 g/dL (ref 30.0–36.0)
MCV: 89.9 fL (ref 80.0–100.0)
Platelets: 327 10*3/uL (ref 150–400)
RBC: 4.27 MIL/uL (ref 3.87–5.11)
RDW: 14.6 % (ref 11.5–15.5)
WBC: 6.1 10*3/uL (ref 4.0–10.5)
nRBC: 0 % (ref 0.0–0.2)

## 2020-11-29 NOTE — Patient Instructions (Addendum)
Your procedure is scheduled on: Monday December 11, 2020. Report to Day [facility] inside [facility] 2nd floor stop by admissions desk first before getting on elevator. To find out your arrival time please call [phone] between 1PM - 3PM on Friday December 08, 2020.  Remember: Instructions that are not followed completely may result in serious medical risk,  up to and including death, or upon the discretion of your surgeon and anesthesiologist your  surgery may need to be rescheduled.     _X__ 1. Do not eat food after midnight the night before your procedure.                 No chewing gum or hard candies. You may drink clear liquids up to 2 hours                 before you are scheduled to arrive for your surgery- DO not drink clear                 liquids within 2 hours of the start of your surgery.                 Clear Liquids include:  water, apple juice without pulp, clear Gatorade, G2 or                  Gatorade Zero (avoid Red/Purple/Blue), Black Coffee or Tea (Do not add                 anything to coffee or tea).  __X__2.  On the morning of surgery brush your teeth with toothpaste and water, you                may rinse your mouth with mouthwash if you wish.  Do not swallow any toothpaste of mouthwash.     _X__ 3.  No Alcohol for 24 hours before or after surgery.   _X__ 4.  Do Not Smoke or use e-cigarettes For 24 Hours Prior to Your Surgery.                 Do not use any chewable tobacco products for at least 6 hours prior to                 Surgery.  _X__  5.  Do not use any recreational drugs (marijuana, cocaine, heroin, ecstasy, MDMA or other)                For at least one week prior to your surgery.  Combination of these drugs with anesthesia                May have life threatening results.  __X__ 6.  Notify your doctor if there is any change in your medical condition      (cold, fever, infections).     Do not wear jewelry, make-up,  hairpins, clips or nail polish. Do not wear lotions, powders, or perfumes. You may wear deodorant. Do not shave 48 hours prior to surgery. Men may shave face and neck. Do not bring valuables to the hospital.    [facility] is not responsible for any belongings or valuables.  Contacts, dentures or bridgework may not be worn into surgery. Leave your suitcase in the car. After surgery it may be brought to your room. For patients admitted to the hospital, discharge time is determined by your treatment team.   Patients discharged the day of surgery will not be allowed to drive  home.   Make arrangements for someone to be with you for the first 24 hours of your Same Day Discharge.   __X__ Take these medicines the morning of surgery with A SIP OF WATER:    1. esomeprazole (NEXIUM) 20 MG  2. PARoxetine Mesylate 7.5 mg  3.   4.  5.  6.  ____ Fleet Enema (as directed)   __X__ Use CHG Soap (or wipes) as directed  ____ Use Benzoyl Peroxide Gel as instructed  __X__ Use inhalers on the day of surgery  albuterol (PROVENTIL HFA;VENTOLIN HFA) 108 (90 Base) MCG/ACT inhaler  beclomethasone (QVAR) 40 MCG/ACT inhaler  ____ Stop metformin 2 days prior to surgery    ____ Take 1/2 of usual insulin dose the night before surgery. No insulin the morning          of surgery.   ____ Call your PCP, cardiologist, or Pulmonologist if taking Coumadin/Plavix/aspirin and ask when to stop before your surgery.   __X__ One Week prior to surgery- Stop Anti-inflammatories such as Ibuprofen, Aleve, Advil, Motrin, meloxicam (MOBIC), diclofenac, etodolac, ketorolac, Toradol, Daypro, piroxicam, Goody's or BC powders. OK TO USE TYLENOL IF NEEDED   __X__ One week prior to surgery stop supplements until after surgery.    ____ Bring C-Pap to the hospital.    If you have any questions regarding your pre-procedure instructions,  Please call Pre-admit Testing at [phone].

## 2020-12-07 ENCOUNTER — Other Ambulatory Visit: Payer: Self-pay

## 2020-12-07 ENCOUNTER — Other Ambulatory Visit
Admission: RE | Admit: 2020-12-07 | Discharge: 2020-12-07 | Disposition: A | Discharge status: Home / Self Care | Payer: 59 | Source: Ambulatory Visit | Attending: Neurosurgery | Admitting: Neurosurgery

## 2020-12-07 DIAGNOSIS — Z20822 Contact with and (suspected) exposure to covid-19: Secondary | ICD-10-CM | POA: Insufficient documentation

## 2020-12-07 DIAGNOSIS — Z01812 Encounter for preprocedural laboratory examination: Secondary | ICD-10-CM | POA: Insufficient documentation

## 2020-12-07 LAB — SARS CORONAVIRUS 2 (TAT 6-24 HRS): SARS Coronavirus 2: NEGATIVE

## 2020-12-11 ENCOUNTER — Inpatient Hospital Stay
Admission: RE | Admit: 2020-12-11 | Discharge: 2020-12-14 | DRG: 027 | Disposition: A | Discharge status: Home Health | Payer: 59 | Attending: Neurosurgery | Admitting: Neurosurgery

## 2020-12-11 ENCOUNTER — Inpatient Hospital Stay: Payer: 59 | Admitting: Urgent Care

## 2020-12-11 ENCOUNTER — Encounter: Payer: Self-pay | Admitting: Neurosurgery

## 2020-12-11 ENCOUNTER — Inpatient Hospital Stay: Payer: 59 | Admitting: Registered Nurse

## 2020-12-11 ENCOUNTER — Other Ambulatory Visit: Payer: Self-pay

## 2020-12-11 ENCOUNTER — Encounter
Admission: RE | Disposition: A | Discharge status: Home Health | Payer: Self-pay | Source: Home / Self Care | Attending: Neurosurgery

## 2020-12-11 DIAGNOSIS — Z9049 Acquired absence of other specified parts of digestive tract: Secondary | ICD-10-CM

## 2020-12-11 DIAGNOSIS — M1990 Unspecified osteoarthritis, unspecified site: Secondary | ICD-10-CM | POA: Diagnosis present

## 2020-12-11 DIAGNOSIS — I10 Essential (primary) hypertension: Secondary | ICD-10-CM | POA: Diagnosis present

## 2020-12-11 DIAGNOSIS — K5900 Constipation, unspecified: Secondary | ICD-10-CM | POA: Diagnosis not present

## 2020-12-11 DIAGNOSIS — Z87442 Personal history of urinary calculi: Secondary | ICD-10-CM | POA: Diagnosis not present

## 2020-12-11 DIAGNOSIS — Q0700 Arnold-Chiari syndrome without spina bifida or hydrocephalus: Secondary | ICD-10-CM

## 2020-12-11 DIAGNOSIS — Z888 Allergy status to other drugs, medicaments and biological substances status: Secondary | ICD-10-CM | POA: Diagnosis not present

## 2020-12-11 DIAGNOSIS — R7303 Prediabetes: Secondary | ICD-10-CM | POA: Diagnosis present

## 2020-12-11 DIAGNOSIS — K219 Gastro-esophageal reflux disease without esophagitis: Secondary | ICD-10-CM | POA: Diagnosis present

## 2020-12-11 DIAGNOSIS — Z79899 Other long term (current) drug therapy: Secondary | ICD-10-CM

## 2020-12-11 DIAGNOSIS — Z832 Family history of diseases of the blood and blood-forming organs and certain disorders involving the immune mechanism: Secondary | ICD-10-CM | POA: Diagnosis not present

## 2020-12-11 DIAGNOSIS — Z886 Allergy status to analgesic agent status: Secondary | ICD-10-CM

## 2020-12-11 DIAGNOSIS — Z90710 Acquired absence of both cervix and uterus: Secondary | ICD-10-CM

## 2020-12-11 DIAGNOSIS — J45909 Unspecified asthma, uncomplicated: Secondary | ICD-10-CM | POA: Diagnosis present

## 2020-12-11 DIAGNOSIS — Z8042 Family history of malignant neoplasm of prostate: Secondary | ICD-10-CM | POA: Diagnosis not present

## 2020-12-11 DIAGNOSIS — M62838 Other muscle spasm: Secondary | ICD-10-CM | POA: Diagnosis not present

## 2020-12-11 HISTORY — PX: CRANIECTOMY: SHX331

## 2020-12-11 HISTORY — DX: Arnold-Chiari syndrome without spina bifida or hydrocephalus: Q07.00

## 2020-12-11 LAB — URINE DRUG SCREEN, QUALITATIVE (ARMC ONLY)
Amphetamines, Ur Screen: NOT DETECTED
Barbiturates, Ur Screen: NOT DETECTED
Benzodiazepine, Ur Scrn: NOT DETECTED
Cannabinoid 50 Ng, Ur ~~LOC~~: POSITIVE — AB
Cocaine Metabolite,Ur ~~LOC~~: NOT DETECTED
MDMA (Ecstasy)Ur Screen: NOT DETECTED
Methadone Scn, Ur: NOT DETECTED
Opiate, Ur Screen: NOT DETECTED
Phencyclidine (PCP) Ur S: NOT DETECTED
Tricyclic, Ur Screen: NOT DETECTED

## 2020-12-11 LAB — MRSA NEXT GEN BY PCR, NASAL: MRSA by PCR Next Gen: NOT DETECTED

## 2020-12-11 LAB — ABO/RH: ABO/RH(D): B POS

## 2020-12-11 LAB — GLUCOSE, CAPILLARY: Glucose-Capillary: 210 mg/dL — ABNORMAL HIGH (ref 70–99)

## 2020-12-11 SURGERY — CRANIECTOMY POSTERIOR FOSSA DECOMPRESSION
Anesthesia: General

## 2020-12-11 MED ORDER — MORPHINE SULFATE (PF) 2 MG/ML IV SOLN
1.0000 mg | INTRAVENOUS | Status: DC | PRN
  Administered 2020-12-11 – 2020-12-12 (×5): 1 mg via INTRAVENOUS
  Filled 2020-12-11 (×5): qty 1

## 2020-12-11 MED ORDER — CHLORHEXIDINE GLUCONATE CLOTH 2 % EX PADS
6.0000 | MEDICATED_PAD | Freq: Every day | CUTANEOUS | Status: DC
  Administered 2020-12-11 – 2020-12-14 (×4): 6 via TOPICAL

## 2020-12-11 MED ORDER — DEXAMETHASONE SODIUM PHOSPHATE 10 MG/ML IJ SOLN
INTRAMUSCULAR | Status: DC | PRN
  Administered 2020-12-11: 10 mg via INTRAVENOUS

## 2020-12-11 MED ORDER — DEXMEDETOMIDINE HCL IN NACL 200 MCG/50ML IV SOLN
INTRAVENOUS | Status: AC
  Filled 2020-12-11: qty 50

## 2020-12-11 MED ORDER — ONDANSETRON HCL 4 MG/2ML IJ SOLN
4.0000 mg | INTRAMUSCULAR | Status: DC | PRN
  Administered 2020-12-11: 4 mg via INTRAVENOUS
  Filled 2020-12-11: qty 2

## 2020-12-11 MED ORDER — ROCURONIUM BROMIDE 100 MG/10ML IV SOLN
INTRAVENOUS | Status: DC | PRN
  Administered 2020-12-11: 10 mg via INTRAVENOUS
  Administered 2020-12-11: 50 mg via INTRAVENOUS

## 2020-12-11 MED ORDER — PROPOFOL 10 MG/ML IV BOLUS
INTRAVENOUS | Status: DC | PRN
  Administered 2020-12-11: 150 mg via INTRAVENOUS
  Administered 2020-12-11: 50 mg via INTRAVENOUS

## 2020-12-11 MED ORDER — CHLORHEXIDINE GLUCONATE 0.12 % MT SOLN
15.0000 mL | Freq: Once | OROMUCOSAL | Status: AC
  Administered 2020-12-11: 15 mL via OROMUCOSAL

## 2020-12-11 MED ORDER — REMIFENTANIL HCL 1 MG IV SOLR
INTRAVENOUS | Status: DC | PRN
  Administered 2020-12-11: 100 ug via INTRAVENOUS
  Administered 2020-12-11: .1 ug/kg/min via INTRAVENOUS

## 2020-12-11 MED ORDER — ACETAMINOPHEN 325 MG PO TABS: 650.0000 mg | ORAL_TABLET | ORAL | Status: DC | PRN

## 2020-12-11 MED ORDER — MEPERIDINE HCL 25 MG/ML IJ SOLN
6.2500 mg | INTRAMUSCULAR | Status: DC | PRN
  Administered 2020-12-11: 6.25 mg via INTRAVENOUS

## 2020-12-11 MED ORDER — SUGAMMADEX SODIUM 200 MG/2ML IV SOLN
INTRAVENOUS | Status: DC | PRN
  Administered 2020-12-11: 200 mg via INTRAVENOUS

## 2020-12-11 MED ORDER — OXYCODONE-ACETAMINOPHEN 5-325 MG PO TABS
1.0000 | ORAL_TABLET | ORAL | Status: DC | PRN
  Administered 2020-12-11: 1 via ORAL
  Filled 2020-12-11: qty 1

## 2020-12-11 MED ORDER — THROMBIN 5000 UNITS EX SOLR
CUTANEOUS | Status: DC | PRN
  Administered 2020-12-11: 5000 [IU] via TOPICAL

## 2020-12-11 MED ORDER — VITAMIN B-6 50 MG PO TABS
50.0000 mg | ORAL_TABLET | Freq: Every day | ORAL | Status: DC
  Administered 2020-12-12 – 2020-12-14 (×3): 50 mg via ORAL
  Filled 2020-12-11 (×3): qty 1

## 2020-12-11 MED ORDER — MIDAZOLAM HCL 2 MG/2ML IJ SOLN
INTRAMUSCULAR | Status: AC
  Filled 2020-12-11: qty 2

## 2020-12-11 MED ORDER — ALBUTEROL SULFATE HFA 108 (90 BASE) MCG/ACT IN AERS: 2.0000 | INHALATION_SPRAY | Freq: Four times a day (QID) | RESPIRATORY_TRACT | Status: DC | PRN

## 2020-12-11 MED ORDER — ALPRAZOLAM 0.25 MG PO TABS: 0.2500 mg | ORAL_TABLET | Freq: Every evening | ORAL | Status: DC | PRN

## 2020-12-11 MED ORDER — FENTANYL CITRATE (PF) 100 MCG/2ML IJ SOLN
25.0000 ug | INTRAMUSCULAR | Status: DC | PRN
  Administered 2020-12-11: 25 ug via INTRAVENOUS
  Administered 2020-12-11: 50 ug via INTRAVENOUS
  Administered 2020-12-11: 25 ug via INTRAVENOUS

## 2020-12-11 MED ORDER — LIDOCAINE HCL (PF) 2 % IJ SOLN
INTRAMUSCULAR | Status: AC
  Filled 2020-12-11: qty 5

## 2020-12-11 MED ORDER — ADULT MULTIVITAMIN W/MINERALS CH
1.0000 | ORAL_TABLET | Freq: Every day | ORAL | Status: DC
  Administered 2020-12-12 – 2020-12-14 (×3): 1 via ORAL
  Filled 2020-12-11 (×3): qty 1

## 2020-12-11 MED ORDER — PROMETHAZINE HCL 25 MG PO TABS
12.5000 mg | ORAL_TABLET | ORAL | Status: DC | PRN
  Filled 2020-12-11: qty 1

## 2020-12-11 MED ORDER — HYDROCHLOROTHIAZIDE 25 MG PO TABS
25.0000 mg | ORAL_TABLET | Freq: Every day | ORAL | Status: DC
  Administered 2020-12-12 – 2020-12-14 (×3): 25 mg via ORAL
  Filled 2020-12-11 (×3): qty 1

## 2020-12-11 MED ORDER — LIDOCAINE HCL (CARDIAC) PF 100 MG/5ML IV SOSY
PREFILLED_SYRINGE | INTRAVENOUS | Status: DC | PRN
  Administered 2020-12-11: 100 mg via INTRAVENOUS

## 2020-12-11 MED ORDER — ROCURONIUM BROMIDE 10 MG/ML (PF) SYRINGE
PREFILLED_SYRINGE | INTRAVENOUS | Status: AC
  Filled 2020-12-11: qty 10

## 2020-12-11 MED ORDER — CEFAZOLIN SODIUM-DEXTROSE 2-4 GM/100ML-% IV SOLN
2.0000 g | Freq: Once | INTRAVENOUS | Status: AC
  Administered 2020-12-11: 2 g via INTRAVENOUS

## 2020-12-11 MED ORDER — CHLORHEXIDINE GLUCONATE 0.12 % MT SOLN
OROMUCOSAL | Status: AC
  Filled 2020-12-11: qty 15

## 2020-12-11 MED ORDER — ONDANSETRON HCL 4 MG/2ML IJ SOLN
INTRAMUSCULAR | Status: AC
  Filled 2020-12-11: qty 2

## 2020-12-11 MED ORDER — FLEET ENEMA 7-19 GM/118ML RE ENEM: 1.0000 | ENEMA | Freq: Once | RECTAL | Status: DC | PRN

## 2020-12-11 MED ORDER — ONDANSETRON HCL 4 MG/2ML IJ SOLN
INTRAMUSCULAR | Status: DC | PRN
  Administered 2020-12-11: 4 mg via INTRAVENOUS

## 2020-12-11 MED ORDER — ALBUTEROL SULFATE (2.5 MG/3ML) 0.083% IN NEBU: 2.5000 mg | INHALATION_SOLUTION | Freq: Four times a day (QID) | RESPIRATORY_TRACT | Status: DC | PRN

## 2020-12-11 MED ORDER — PROPOFOL 10 MG/ML IV BOLUS
INTRAVENOUS | Status: AC
  Filled 2020-12-11: qty 20

## 2020-12-11 MED ORDER — MEPERIDINE HCL 25 MG/ML IJ SOLN
INTRAMUSCULAR | Status: AC
  Filled 2020-12-11: qty 1

## 2020-12-11 MED ORDER — BACITRACIN-NEOMYCIN-POLYMYXIN OINTMENT TUBE
TOPICAL_OINTMENT | CUTANEOUS | Status: DC | PRN
  Administered 2020-12-11: 1 via TOPICAL

## 2020-12-11 MED ORDER — DEXMEDETOMIDINE (PRECEDEX) IN NS 20 MCG/5ML (4 MCG/ML) IV SYRINGE
PREFILLED_SYRINGE | INTRAVENOUS | Status: DC | PRN
  Administered 2020-12-11: 4 ug via INTRAVENOUS
  Administered 2020-12-11: 8 ug via INTRAVENOUS
  Administered 2020-12-11: 4 ug via INTRAVENOUS

## 2020-12-11 MED ORDER — BISACODYL 10 MG RE SUPP: 10.0000 mg | Freq: Every day | RECTAL | Status: DC | PRN

## 2020-12-11 MED ORDER — FENTANYL CITRATE (PF) 100 MCG/2ML IJ SOLN
INTRAMUSCULAR | Status: AC
  Filled 2020-12-11: qty 2

## 2020-12-11 MED ORDER — HYDROCODONE-ACETAMINOPHEN 5-325 MG PO TABS
1.0000 | ORAL_TABLET | ORAL | Status: DC | PRN
  Administered 2020-12-11: 1 via ORAL
  Filled 2020-12-11: qty 1

## 2020-12-11 MED ORDER — LACTATED RINGERS IV SOLN: INTRAVENOUS | Status: DC | PRN

## 2020-12-11 MED ORDER — CYCLOBENZAPRINE HCL 10 MG PO TABS
10.0000 mg | ORAL_TABLET | Freq: Three times a day (TID) | ORAL | Status: DC
  Filled 2020-12-11 (×3): qty 1

## 2020-12-11 MED ORDER — CEFAZOLIN SODIUM-DEXTROSE 2-4 GM/100ML-% IV SOLN
INTRAVENOUS | Status: AC
  Filled 2020-12-11: qty 100

## 2020-12-11 MED ORDER — PAROXETINE MESYLATE 7.5 MG PO CAPS: 7.5000 mg | ORAL_CAPSULE | Freq: Every day | ORAL | Status: DC

## 2020-12-11 MED ORDER — LACTATED RINGERS IV SOLN: INTRAVENOUS | Status: DC

## 2020-12-11 MED ORDER — PANTOPRAZOLE SODIUM 40 MG PO TBEC
40.0000 mg | DELAYED_RELEASE_TABLET | Freq: Every day | ORAL | Status: DC
  Administered 2020-12-11 – 2020-12-14 (×4): 40 mg via ORAL
  Filled 2020-12-11 (×4): qty 1

## 2020-12-11 MED ORDER — PHENYLEPHRINE HCL-NACL 20-0.9 MG/250ML-% IV SOLN
INTRAVENOUS | Status: DC | PRN
  Administered 2020-12-11: 30 ug/min via INTRAVENOUS

## 2020-12-11 MED ORDER — TRAZODONE HCL 50 MG PO TABS: 50.0000 mg | ORAL_TABLET | Freq: Every evening | ORAL | Status: DC | PRN

## 2020-12-11 MED ORDER — POLYETHYLENE GLYCOL 3350 17 G PO PACK: 17.0000 g | PACK | Freq: Every day | ORAL | Status: DC | PRN

## 2020-12-11 MED ORDER — ORAL CARE MOUTH RINSE: 15.0000 mL | Freq: Once | OROMUCOSAL | Status: AC

## 2020-12-11 MED ORDER — BUTALBITAL-APAP-CAFFEINE 50-325-40 MG PO TABS
1.0000 | ORAL_TABLET | Freq: Four times a day (QID) | ORAL | Status: DC | PRN
  Administered 2020-12-12 – 2020-12-14 (×3): 1 via ORAL
  Filled 2020-12-11 (×3): qty 1

## 2020-12-11 MED ORDER — FENTANYL CITRATE (PF) 100 MCG/2ML IJ SOLN
INTRAMUSCULAR | Status: DC | PRN
  Administered 2020-12-11: 100 ug via INTRAVENOUS
  Administered 2020-12-11 (×2): 50 ug via INTRAVENOUS

## 2020-12-11 MED ORDER — SENNA 8.6 MG PO TABS
1.0000 | ORAL_TABLET | Freq: Two times a day (BID) | ORAL | Status: DC
  Administered 2020-12-12 – 2020-12-14 (×5): 8.6 mg via ORAL
  Filled 2020-12-11 (×5): qty 1

## 2020-12-11 MED ORDER — SODIUM CHLORIDE 0.9 % IV SOLN: INTRAVENOUS | Status: DC

## 2020-12-11 MED ORDER — PAROXETINE HCL 20 MG PO TABS
10.0000 mg | ORAL_TABLET | Freq: Every day | ORAL | Status: DC
  Administered 2020-12-12 – 2020-12-14 (×3): 10 mg via ORAL
  Filled 2020-12-11: qty 0.5
  Filled 2020-12-11 (×5): qty 1

## 2020-12-11 MED ORDER — ACETAMINOPHEN 650 MG RE SUPP: 650.0000 mg | RECTAL | Status: DC | PRN

## 2020-12-11 MED ORDER — ONDANSETRON HCL 4 MG PO TABS
4.0000 mg | ORAL_TABLET | ORAL | Status: DC | PRN
  Administered 2020-12-12: 4 mg via ORAL
  Filled 2020-12-11: qty 1

## 2020-12-11 MED ORDER — PHENYLEPHRINE HCL (PRESSORS) 10 MG/ML IV SOLN
INTRAVENOUS | Status: DC | PRN
  Administered 2020-12-11: 100 ug via INTRAVENOUS
  Administered 2020-12-11: 200 ug via INTRAVENOUS
  Administered 2020-12-11 (×5): 100 ug via INTRAVENOUS

## 2020-12-11 MED ORDER — REMIFENTANIL HCL 1 MG IV SOLR
INTRAVENOUS | Status: AC
  Filled 2020-12-11: qty 1000

## 2020-12-11 MED ORDER — FENTANYL CITRATE (PF) 100 MCG/2ML IJ SOLN
INTRAMUSCULAR | Status: AC
  Administered 2020-12-11: 50 ug via INTRAVENOUS
  Filled 2020-12-11: qty 2

## 2020-12-11 MED ORDER — ONDANSETRON HCL 4 MG/2ML IJ SOLN: 4.0000 mg | Freq: Once | INTRAMUSCULAR | Status: DC | PRN

## 2020-12-11 MED ORDER — DEXAMETHASONE SODIUM PHOSPHATE 10 MG/ML IJ SOLN
INTRAMUSCULAR | Status: AC
  Filled 2020-12-11: qty 1

## 2020-12-11 MED ORDER — LIDOCAINE-EPINEPHRINE 1 %-1:100000 IJ SOLN
INTRAMUSCULAR | Status: DC | PRN
  Administered 2020-12-11: 7 mL
  Administered 2020-12-11: 13 mL

## 2020-12-11 MED ORDER — HEMOSTATIC AGENTS (NO CHARGE) OPTIME
TOPICAL | Status: DC | PRN
  Administered 2020-12-11 (×2): 1 via TOPICAL

## 2020-12-11 SURGICAL SUPPLY — 62 items
BLADE SURG 15 STRL LF DISP TIS (BLADE) ×1 IMPLANT
BLADE SURG 15 STRL SS (BLADE) ×1
BUR NEURO DRILL SOFT 3.0X3.8M (BURR) ×2 IMPLANT
CHLORAPREP W/TINT 26 (MISCELLANEOUS) ×2 IMPLANT
CORD BIPOLAR FORCEPS 12FT (ELECTRODE) ×2 IMPLANT
COUNTER NEEDLE 20/40 LG (NEEDLE) ×2 IMPLANT
COVER LIGHT HANDLE STERIS (MISCELLANEOUS) ×4 IMPLANT
CUP MEDICINE 2OZ PLAST GRAD ST (MISCELLANEOUS) ×2 IMPLANT
DERMABOND ADVANCED (GAUZE/BANDAGES/DRESSINGS) ×1
DERMABOND ADVANCED .7 DNX12 (GAUZE/BANDAGES/DRESSINGS) ×1 IMPLANT
DRAPE INCISE IOBAN 66X45 STRL (DRAPES) ×2 IMPLANT
DRAPE MICROSCOPE SPINE 48X150 (DRAPES) ×2 IMPLANT
DRAPE THYROID T SHEET (DRAPES) ×2 IMPLANT
DRSG OPSITE POSTOP 4X8 (GAUZE/BANDAGES/DRESSINGS) IMPLANT
DRSG TELFA 4X3 1S NADH ST (GAUZE/BANDAGES/DRESSINGS) ×2 IMPLANT
DURASEAL SPINE SEALANT 3ML (MISCELLANEOUS) ×2 IMPLANT
ELECT CAUTERY BLADE TIP 2.5 (TIP) ×2
ELECTRODE CAUTERY BLDE TIP 2.5 (TIP) ×1 IMPLANT
FEE INTRAOP CADWELL SUPPLY NCS (MISCELLANEOUS) ×1 IMPLANT
GAUZE 4X4 16PLY ~~LOC~~+RFID DBL (SPONGE) ×2 IMPLANT
GAUZE SPONGE 4X4 12PLY STRL (GAUZE/BANDAGES/DRESSINGS) ×2 IMPLANT
GAUZE XEROFORM 1X8 LF (GAUZE/BANDAGES/DRESSINGS) ×2 IMPLANT
GLOVE SRG 8 PF TXTR STRL LF DI (GLOVE) ×1 IMPLANT
GLOVE SURG SYN 6.5 ES PF (GLOVE) ×4 IMPLANT
GLOVE SURG SYN 8.0 (GLOVE) ×4 IMPLANT
GLOVE SURG UNDER POLY LF SZ6.5 (GLOVE) ×4 IMPLANT
GLOVE SURG UNDER POLY LF SZ8 (GLOVE) ×1
GOWN SRG LRG LVL 4 IMPRV REINF (GOWNS) ×2 IMPLANT
GOWN STRL REIN LRG LVL4 (GOWNS) ×2
GOWN STRL REUS W/ TWL XL LVL3 (GOWN DISPOSABLE) ×1 IMPLANT
GOWN STRL REUS W/TWL XL LVL3 (GOWN DISPOSABLE) ×1
GRADUATE 1200CC STRL 31836 (MISCELLANEOUS) ×4 IMPLANT
GRAFT DURAL BIODESIGN 7X10 (Graft) ×2 IMPLANT
HEMOSTAT SURGICEL 2X3 (HEMOSTASIS) ×2 IMPLANT
INTRAOP CADWELL SUPPLY FEE NCS (MISCELLANEOUS) ×1
INTRAOP DISP SUPPLY FEE NCS (MISCELLANEOUS) ×1
IV CATH ANGIO 14GX1.88 NO SAFE (IV SOLUTION) ×2 IMPLANT
KIT TURNOVER KIT A (KITS) ×2 IMPLANT
MANIFOLD NEPTUNE II (INSTRUMENTS) ×2 IMPLANT
MARKER SKIN DUAL TIP RULER LAB (MISCELLANEOUS) ×2 IMPLANT
NEEDLE HYPO 22GX1.5 SAFETY (NEEDLE) ×2 IMPLANT
PACK LAMINECTOMY NEURO (CUSTOM PROCEDURE TRAY) ×2 IMPLANT
PAD ARMBOARD 7.5X6 YLW CONV (MISCELLANEOUS) ×4 IMPLANT
PATTIES SURGICAL .5 X.5 (GAUZE/BANDAGES/DRESSINGS) ×4 IMPLANT
PATTIES SURGICAL .5X1.5 (GAUZE/BANDAGES/DRESSINGS) ×2 IMPLANT
PIN MAYFIELD SKULL DISP (PIN) ×2 IMPLANT
SPOGE SURGIFLO 8M (HEMOSTASIS) ×1
SPONGE SURGIFLO 8M (HEMOSTASIS) ×1 IMPLANT
STAPLER SKIN PROX 35W (STAPLE) ×2 IMPLANT
SUT BONE WAX W31G (SUTURE) ×2 IMPLANT
SUT ETHILON 3 0 PS 1 (SUTURE) ×2 IMPLANT
SUT NURALON 4 0 TR CR/8 (SUTURE) ×4 IMPLANT
SUT POLYSORB 2-0 5X18 GS-10 (SUTURE) ×2 IMPLANT
SUT PROLENE 5 0 RB 2 (SUTURE) ×6 IMPLANT
SUT VIC AB 0 CT1 18XCR BRD 8 (SUTURE) ×1 IMPLANT
SUT VIC AB 0 CT1 8-18 (SUTURE) ×1
SYR 30ML LL (SYRINGE) ×2 IMPLANT
TAPE CLOTH 3X10 WHT NS LF (GAUZE/BANDAGES/DRESSINGS) ×2 IMPLANT
TAPE CLOTH SURG 4X10 WHT LF (GAUZE/BANDAGES/DRESSINGS) ×2 IMPLANT
TOWEL OR 17X26 4PK STRL BLUE (TOWEL DISPOSABLE) ×4 IMPLANT
TRAY FOLEY MTR SLVR 16FR STAT (SET/KITS/TRAYS/PACK) ×2 IMPLANT
TUBING CONNECTING 10 (TUBING) ×2 IMPLANT

## 2020-12-11 NOTE — Plan of Care (Signed)
  Problem: Clinical Measurements: Goal: Will remain free from infection Outcome: Progressing Goal: Diagnostic test results will improve Outcome: Progressing   Problem: Pain Managment: Goal: General experience of comfort will improve Outcome: Progressing   

## 2020-12-11 NOTE — Progress Notes (Signed)
This RN attempted to find pt daughter in OR waiting room, did not find. Attempted to call daughter to inform of transfer to ICU 18 but did not answer.

## 2020-12-11 NOTE — Progress Notes (Signed)
eLink Physician-Brief Progress Note Patient Name: ALEKHYA GRAVLIN DOB: [birth year] MRN: [id number]   Date of Service  12/11/2020  HPI/Events of Note  Patient admitted to the ICU post-operatively following a Chiari Malformation decompression, patient is extubated and stable from a cardiopulmonary standpoint.  eICU Interventions  New Patient Evaluation.        Migdalia Dk 12/11/2020, 8:27 PM

## 2020-12-11 NOTE — Transfer of Care (Signed)
Immediate Anesthesia Transfer of Care Note  Patient: Abigail Huffman  Procedure(s) Performed: SUBOCCIPITAL CRANIECTOMY, C1 LAMINECTOMY, DURAPLASTY  Patient Location: PACU  Anesthesia Type:General  Level of Consciousness: awake, alert  and oriented  Airway & Oxygen Therapy: Patient Spontanous Breathing  Post-op Assessment: Report given to RN, Post -op Vital signs reviewed and stable, Patient moving all extremities X 4 and Patient able to stick tongue midline  Post vital signs: Reviewed and stable  Last Vitals:  Vitals Value Taken Time  BP 166/98 12/11/20 1805  Temp 35.6 C 12/11/20 1805  Pulse 114 12/11/20 1810  Resp 18 12/11/20 1810  SpO2 100 % 12/11/20 1810  Vitals shown include unvalidated device data.  Last Pain:  Vitals:   12/11/20 1141  TempSrc: Temporal  PainSc: 0-No pain         Complications: No notable events documented.

## 2020-12-11 NOTE — Anesthesia Preprocedure Evaluation (Signed)
Anesthesia Evaluation  Patient identified by MRN, date of birth, ID band Patient awake    Reviewed: Allergy & Precautions, NPO status , Patient's Chart, lab work & pertinent test results  History of Anesthesia Complications (+) history of anesthetic complications (Told she woke up but doesn't remember waking in OR)  Airway Mallampati: II  TM Distance: >3 FB Neck ROM: Full    Dental  (+) Poor Dentition, Missing   Pulmonary asthma ,    Pulmonary exam normal        Cardiovascular hypertension, Pt. on medications Normal cardiovascular exam+ Valvular Problems/Murmurs      Neuro/Psych  Neuromuscular disease negative psych ROS   GI/Hepatic Neg liver ROS, PUD, GERD  Medicated,  Endo/Other  negative endocrine ROS  Renal/GU negative Renal ROS  negative genitourinary   Musculoskeletal  (+) Arthritis , Osteoarthritis,    Abdominal   Peds negative pediatric ROS (+)  Hematology negative hematology ROS (+) anemia ,   Anesthesia Other Findings Anemia    Arthritis    Asthma    Complication of anesthesia 2009 woke up during procedure  GERD (gastroesophageal reflux disease)  Heart murmur    History of kidney stones    Hypertension    Pre-diabetes    Ulcer of gastric fundus       Reproductive/Obstetrics negative OB ROS                             Anesthesia Physical Anesthesia Plan  ASA: 2  Anesthesia Plan: General   Post-op Pain Management:    Induction: Intravenous  PONV Risk Score and Plan: 2 and Propofol infusion, Ondansetron and Midazolam  Airway Management Planned: Oral ETT  Additional Equipment:   Intra-op Plan:   Post-operative Plan: Extubation in OR  Informed Consent: I have reviewed the patients History and Physical, chart, labs and discussed the procedure including the risks, benefits and alternatives for the proposed anesthesia with the patient or authorized representative  who has indicated his/her understanding and acceptance.       Plan Discussed with: CRNA, Anesthesiologist and Surgeon  Anesthesia Plan Comments:         Anesthesia Quick Evaluation

## 2020-12-11 NOTE — H&P (Signed)
Abigail Huffman is an 60 y.o. female.   Chief Complaint: Headaches HPI: Abigail Huffman is here for re-evaluation after her MRI of the brain. She states she is continues to have the symptoms of the headaches in the arm numbness/pain. She does not endorse any significant vision changes but she does note some blurry vision. She has had an ophthalmology visit earlier this year. She states that they did not comment on any abnormal findings. She did go for the MRI of the brain showing a Chiari malformation and we discussed decompression.   Past Medical History:  Diagnosis Date   Anemia    Arthritis    Asthma    Complication of anesthesia 2009   woke up during procedure   GERD (gastroesophageal reflux disease)    Heart murmur    History of kidney stones    Hypertension    Pre-diabetes    Ulcer of gastric fundus     Past Surgical History:  Procedure Laterality Date   ABDOMINAL HYSTERECTOMY  1991   BREAST BIOPSY Right 2006   benign   CARPAL TUNNEL RELEASE Right 1999   CHOLECYSTECTOMY  1998   COLONOSCOPY  2009    Family History  Problem Relation Age of Onset   Hematuria Father    Prostate cancer Father    Sickle cell anemia Sister    Breast cancer Neg Hx    Social History:  reports that she has never smoked. She has never used smokeless tobacco. She reports current drug use. Drug: Marijuana. She reports that she does not drink alcohol.  Allergies:  Allergies  Allergen Reactions   Gabapentin Hives   Nsaids     Avoid due to stomach ulcers    Pregabalin Other (See Comments)    Hematuria   Aspirin Rash    Medications Prior to Admission  Medication Sig Dispense Refill   albuterol (PROVENTIL HFA;VENTOLIN HFA) 108 (90 Base) MCG/ACT inhaler Inhale 2 puffs into the lungs every 6 (six) hours as needed for shortness of breath or wheezing.     albuterol (PROVENTIL) (2.5 MG/3ML) 0.083% nebulizer solution Take 2.5 mg by nebulization every 6 (six) hours as needed for wheezing or shortness of  breath.     ALPRAZolam (XANAX) 0.25 MG tablet Take 0.25 mg by mouth at bedtime as needed for sleep.     beclomethasone (QVAR) 40 MCG/ACT inhaler Inhale 2 puffs into the lungs 2 (two) times daily as needed (shortness of breath).     cetirizine (ZYRTEC) 10 MG tablet Take 10 mg by mouth daily.     esomeprazole (NEXIUM) 20 MG capsule Take 20 mg by mouth daily as needed (acid reflux).     hydrochlorothiazide (HYDRODIURIL) 25 MG tablet Take 25 mg by mouth daily.     Multiple Vitamin (MULTIVITAMIN WITH MINERALS) TABS tablet Take 1 tablet by mouth daily.     PARoxetine Mesylate 7.5 MG CAPS Take 7.5 mg by mouth daily.     pyridOXINE (VITAMIN B-6) 50 MG tablet Take 50 mg by mouth daily.     traZODone (DESYREL) 50 MG tablet Take 50 mg by mouth at bedtime as needed for sleep.      Results for orders placed or performed during the hospital encounter of 12/11/20 (from the past 48 hour(s))  Urine Drug Screen, Qualitative ([facility] only)     Status: Abnormal   Collection Time: 12/11/20 12:46 PM  Result Value Ref Range   Tricyclic, Ur Screen NONE DETECTED NONE DETECTED   Amphetamines, Ur Screen  NONE DETECTED NONE DETECTED   MDMA (Ecstasy)Ur Screen NONE DETECTED NONE DETECTED   Cocaine Metabolite,Ur [place] NONE DETECTED NONE DETECTED   Opiate, Ur Screen NONE DETECTED NONE DETECTED   Phencyclidine (PCP) Ur S NONE DETECTED NONE DETECTED   Cannabinoid 50 Ng, Ur [place] POSITIVE (A) NONE DETECTED   Barbiturates, Ur Screen NONE DETECTED NONE DETECTED   Benzodiazepine, Ur Scrn NONE DETECTED NONE DETECTED   Methadone Scn, Ur NONE DETECTED NONE DETECTED    Comment: (NOTE) Tricyclics + metabolites, urine    Cutoff 1000 ng/mL Amphetamines + metabolites, urine  Cutoff 1000 ng/mL MDMA (Ecstasy), urine              Cutoff 500 ng/mL Cocaine Metabolite, urine          Cutoff 300 ng/mL Opiate + metabolites, urine        Cutoff 300 ng/mL Phencyclidine (PCP), urine         Cutoff 25 ng/mL Cannabinoid, urine                 Cutoff  50 ng/mL Barbiturates + metabolites, urine  Cutoff 200 ng/mL Benzodiazepine, urine              Cutoff 200 ng/mL Methadone, urine                   Cutoff 300 ng/mL  The urine drug screen provides only a preliminary, unconfirmed analytical test result and should not be used for non-medical purposes. Clinical consideration and professional judgment should be applied to any positive drug screen result due to possible interfering substances. A more specific alternate chemical method must be used in order to obtain a confirmed analytical result. Gas chromatography / mass spectrometry (GC/MS) is the preferred confirm atory method. Performed at [facility], [address] [place], [place] [address]    No results found.  Review of Systems General ROS: Negative Respiratory ROS: Negative Cardiovascular ROS: Negative Gastrointestinal ROS: Negative Genito-Urinary ROS: Negative Musculoskeletal ROS: Negative Neurological ROS: Positive for headaches, arm numbness Dermatological ROS: Negative   Blood pressure (!) 163/86, pulse 73, temperature 98 F (36.7 C), temperature source Temporal, resp. rate 16, height 5\' 3"  (1.6 m), weight 84.4 kg, last menstrual period 09/19/1988, SpO2 100 %. Physical Exam  General appearance: Alert, cooperative, in no acute distress Head: Normocephalic, atraumatic Eyes: Normal, EOM intact Oropharynx: Wearing facemask Neck: Supple, range of motion appears full Ext: No edema in LE bilaterally  Neurologic exam:  Mental status: alertness: alert,affect: normal Speech: fluent and clear Cranial nerves:  II: Visual fields are full by confrontation, no ptosis III/IV/VI: extra-ocular motions intact bilaterally V/VII:no evidence of facial droop or weakness and facial sensation intact to light touch VIII: hearing mildly decreased on the right side XI: trapezius strength symmetric, sternocleidomastoid strength symmetric XII: tongue strength symmetric   Motor:strength symmetric 5/5, normal muscle mass and tone in all extremities  Sensory: intact to light touch in all extremities Coordination: intact finger to nose Gait: normal  Assessment/Plan Proceed with Chiari malformation decompression  09/21/1988, MD 12/11/2020, 1:36 PM

## 2020-12-11 NOTE — Anesthesia Procedure Notes (Signed)
Procedure Name: Intubation Date/Time: 12/11/2020 2:28 PM Performed by: Iran Planas, CRNA Pre-anesthesia Checklist: Patient identified, Patient being monitored, Timeout performed, Emergency Drugs available and Suction available Patient Re-evaluated:Patient Re-evaluated prior to induction Oxygen Delivery Method: Circle system utilized Preoxygenation: Pre-oxygenation with 100% oxygen Induction Type: IV induction Ventilation: Mask ventilation without difficulty Laryngoscope Size: McGraph and 4 Grade View: Grade I Tube type: Oral Tube size: 7.0 mm Number of attempts: 1 Airway Equipment and Method: Stylet Placement Confirmation: ETT inserted through vocal cords under direct vision, positive ETCO2 and breath sounds checked- equal and bilateral Secured at: 21 cm Tube secured with: Tape Dental Injury: Teeth and Oropharynx as per pre-operative assessment

## 2020-12-12 ENCOUNTER — Encounter: Payer: Self-pay | Admitting: Neurosurgery

## 2020-12-12 MED ORDER — ACETAMINOPHEN 325 MG PO TABS
325.0000 mg | ORAL_TABLET | ORAL | Status: DC
  Administered 2020-12-12 – 2020-12-14 (×13): 325 mg via ORAL
  Filled 2020-12-12 (×12): qty 1

## 2020-12-12 MED ORDER — LIDOCAINE 5 % EX PTCH
2.0000 | MEDICATED_PATCH | CUTANEOUS | Status: DC
  Administered 2020-12-12 – 2020-12-14 (×3): 2 via TRANSDERMAL
  Filled 2020-12-12 (×3): qty 2

## 2020-12-12 MED ORDER — OXYCODONE HCL 5 MG PO TABS
10.0000 mg | ORAL_TABLET | ORAL | Status: DC | PRN
  Administered 2020-12-12 – 2020-12-14 (×12): 10 mg via ORAL
  Filled 2020-12-12 (×12): qty 2

## 2020-12-12 MED ORDER — OXYCODONE HCL 5 MG PO TABS
5.0000 mg | ORAL_TABLET | ORAL | Status: DC | PRN
  Filled 2020-12-12: qty 1

## 2020-12-12 MED ORDER — CYCLOBENZAPRINE HCL 10 MG PO TABS
10.0000 mg | ORAL_TABLET | Freq: Three times a day (TID) | ORAL | Status: DC
  Administered 2020-12-12 – 2020-12-14 (×7): 10 mg via ORAL
  Filled 2020-12-12 (×9): qty 1

## 2020-12-12 MED ORDER — ACETAMINOPHEN 650 MG RE SUPP: 650.0000 mg | RECTAL | Status: DC

## 2020-12-12 MED ORDER — CYCLOBENZAPRINE HCL 10 MG PO TABS: 10.0000 mg | ORAL_TABLET | Freq: Four times a day (QID) | ORAL | Status: DC

## 2020-12-12 NOTE — Op Note (Signed)
Operative Note   SURGERY DATE:  12/12/2020   PRE-OP DIAGNOSIS:  Chiari malformation   POST-OP DIAGNOSIS: Post-Op Diagnosis Codes:  Chiari malformation    SURGEON:     * Nathaniel Man, MD       Abigail Charity, PA Assistant    Procedure(s): CRANIECTOMY, SUBOCCIPITAL WITH CERVICAL LAMINECTOMY FOR DECOMPRESSION OF MEDULLA AND SPINAL CORD, WITH OR WITHOUT DURAL GRAFT (EG, ARNOLD-CHIARI MALFORMATION)---Suboccipital Crani With C1 Laminectomy and Duraplasty and Occipital to C4 posterior fusion ARTHRODESIS, POSTERIOR OR POSTEROLATERAL TECHNIQUE, SINGLE LEVEL; CERVICAL BELOW C2 SEGMENT ARTHRODESIS, POSTERIOR OR POSTEROLATERAL TECHNIQUE, SINGLE LEVEL; EACH ADDITIONAL VERTEBRAL SEGMENT (LIST IN ADDITION TO PRIMARY PROCEDURE) ALLOGRAFT, MORSELIZED, OR PLACEMENT OF OSTEOPROMOTIVE MATERIAL, FOR SPINE SURGERY ONLY (LIST IN ADDITION TO PRIMARY PROCEDURE) AUTOGRAFT FOR SPINE SURGERY ONLY (INCL HARVESTING GRAFT); LOCAL (EG, RIBS, SPINOUS PROCESS, OR LAMINAR FRAGMENT) OBTAINED FROM SAME INCISION (LIST IN ADDITION TO PRIMARY PROCEDURE) MICROSURGICAL TECHNIQUES, REQUIRING USE OF OPERATING MICROSCOPE (LIST IN ADDITION TO PRIMARY PROCEDURE) ARTHRODESIS AND FUSION, POSTERIOR TECHNIQUE, CRANIOCERVICAL (OCCIPUT-C4) MUSCLE, MYOCUTANEOUS, OR FASCIOCUTANEOUS FLAP; BACK     ANESTHESIA: General    OPERATIVE FINDINGS:Descent of tonsils to C1 lamina, multiple adhesion limiting CSF flow   OPERATIVE REPORT:     Indication   Abigail Huffman was seen in clinic on 8/23 and found to have symptoms and imaging consistent with a Chiari I malformation. Given her ongoing symptoms of headache and difficulty performing daily activities, we discussed surgery for decompression.  We discussed risks of surgery including infection, bleeding, spinal cord/nerve root injury, vertebral artery injury, CSF leak, neck pain,  persistent symptoms/failure to improve, development of deformity, need for further surgery, and the risks of anesthesia.  Patient understood these risks. She elected to proceed with surgery for definitive treatment of her symptoms.    Procedure   The patient was brought from the preoperative center with intravenous access in place.  She underwent general anesthesia and endotracheal tube intubation. The [place] head holder was applied. The patient was then rotated onto the operative table prone where all pressure points were appropriately padded and the head was then fixated to table with [place] head holder.  Clippers were then to use to remove hair off the base of the occiput. The skin was then thoroughly cleansed. A planned midline incision was established with the marking pen extending from the occiput to the spinous process of C2.  Sterile prep and drape were then applied a timeout was then observed perioperative antibiotic prophylaxis was administered for this procedure.    The skin was instilled with local anesthetic. The skin was opened sharply to the fascia and then taken through the avascular plane of the muscle. The suboccipital bone and C1 lamina were exposed using cautery. Retractors were inserted. The posterior ligament was removed from foramen magnum and C1 exposing laterally. Next, a high speed drill was used to perform a 3x3 cm craniectomy incorporating the foramen magnum not exceeding the superior nuchal line. The C1 lamina was next drilled and a 1-2 cm piece of the lamina removed centrally.    The dura was opened in a Y shaped fashion extending below the C1 level. The cerebellar tonsils were descended to this level and swollen. There were numerous adhesions medially and laterally. The microscope was brought into the field for microdissection. The tonsils were manipulated until free from the arachnoid and adjacent tissue. All vessels were preserved. The 4th ventricle was identified and adhesions removed exposing the choroid plexus. CSF flow was seen from here. Next, both  tonsils were coagulated until they  were above the C1 level which allowed for CSF flow to descend to spine. Hemostasis was achieved. Next, a [employer] dural graft was used to perform duraplasty. Maxon suture was used to primarily sew the graft. The cavity was filled with saline and after Valsalva, no leak seen. Duraseal was placed.   The wound was irrigated with saline. The overlying muscle and fascia were closed with interrupted 0 Vicryl sutures. The galea/dermis was closed with 2-0 and 3-0 interrupted Vicryl sutures. The skin above was closed with staples. The wound was dressed with Bacitracin ointment, Xeroform, and gauze with adhesive dressing    The patient had general anesthesia reversed and was extubated following the procedure. The patient awoke following commands with symmetric movement and no focal cranial nerve deficits and taken to the PACU where they continued recovery. Following the procedure, I spoke with the patient's family about the procedure and answered all questions.       ESTIMATED BLOOD LOSS:   100 cc    IMPLANT GRAFT DURAL BIODESIGN 7X10 - [id number]  Inventory Item: GRAFT DURAL BIODESIGN 7X10 Serial no.:  Model/Cat no.: [id number]  Implant name: GRAFT Jonelle [employer] [id number] - [id number] Laterality: N/A Area: Cranial  Manufacturer: [employer] Date of Manufacture:    Action: Implanted Number Used: 1   Device Identifier:  Device Identifier Type:         I performed the case in its entirety with assistance of Abigail Huffman, [place]   Abigail Chris, MD [phone]

## 2020-12-12 NOTE — Evaluation (Signed)
Physical Therapy Evaluation Patient Details Name: Abigail Huffman MRN: [id number] DOB: 09-19-1960 Today's Date: 12/12/2020  History of Present Illness  Patient is a 60 year old female found to have symptoms and imaging consistent with a Chiari I malformation. Patient underwent double craniectomy and C1 laminectomy with duraplasty on 12/11/20.  Clinical Impression  Patient is cooperative and pleasant with PT evaluation. Daughter at the bedside. Patient reports she is independent with mobility at baseline with occasional use of a rollator at home. She reports she has UE weakness/numbness as well as headaches at baseline.  Patient currently needs Min guard assistance for bed mobility and transfers. No headache is reported during evaluation. Patient does report 8/10 pain in her neck that does not worsen with activity. Mild weakness noted in BUE (right more than left) with functional activity. Patient ambulated 65 ft with rolling walker with no loss of balance, Min guard assistance initially progressing to supervision.  Educated patient and her daughter on general mobility precautions, positioning of UE for pain management/comfort while seated, and importance of routine walking for conditioning following surgery.  Anticipated patient will be able to discharge home with support from her family and HHPT. Recommend to continue PT to maximize independence and facilitate return to prior level of function.      Recommendations for follow up therapy are one component of a multi-disciplinary discharge planning process, led by the attending physician.  Recommendations may be updated based on patient status, additional functional criteria and insurance authorization.  Follow Up Recommendations Home health PT    Equipment Recommendations  None recommended by PT    Recommendations for Other Services       Precautions / Restrictions Precautions Precautions: Fall Restrictions Weight Bearing Restrictions: No       Mobility  Bed Mobility Overal bed mobility: Needs Assistance Bed Mobility: Supine to Sit     Supine to sit: HOB elevated;Min guard     General bed mobility comments: verbal cues for safety and technique. increased time and effort required. no headache is reported with upright activity. pain consistently reported at 8/10 in her neck with and without movement. mild dizziness initially reported with sitting upright that subsides with increased sitting time    Transfers Overall transfer level: Needs assistance Equipment used: Rolling walker (2 wheeled) Transfers: Sit to/from Stand Sit to Stand: Min guard         General transfer comment: 2 standing bouts performed. Min guard for safety with standing from bed and from bed side commode. verbal cues for hand placement for safety. mild dizziness reported initially with standing that subsides.  Ambulation/Gait Ambulation/Gait assistance: Supervision;Min guard (Min guard initially progressing to supervision) Gait Distance (Feet): 65 Feet Assistive device: Rolling walker (2 wheeled) Gait Pattern/deviations: Narrow base of support;Step-through pattern Gait velocity: decreased   General Gait Details: patient ambulated into hallway without dizziness and without significant change in vitals. patient initially relying heavily on rolling walker for support with ambulation. encouraged patient to continue using rolling walker for safety and fall prevention with longer distance ambulation. patient was able to ambulate a short distance from bed to bed side commode (less than 5 ft) without rolling walker with close Min guard assistance and no significant loss of balance.  Stairs            Wheelchair Mobility    Modified Rankin (Stroke Patients Only)       Balance Overall balance assessment: Needs assistance Sitting-balance support: Feet supported;No upper extremity supported Sitting balance-Leahy Scale:  Good     Standing  balance support: Bilateral upper extremity supported Standing balance-Leahy Scale: Fair Standing balance comment: patient relying heavily on rolling walker for support at times                             Pertinent Vitals/Pain Pain Assessment: 0-10 Pain Score: 8  Pain Location: posterior neck Pain Descriptors / Indicators: Guarding Pain Intervention(s): Repositioned;Monitored during session;Limited activity within patient's tolerance    Home Living Family/patient expects to be discharged to:: Private residence Living Arrangements: Spouse/significant other;Children Available Help at Discharge: Family Type of Home: House Home Access: Stairs to enter Entrance Stairs-Rails: Can reach both;Right;Left Entrance Stairs-Number of Steps: 5 Home Layout: One level Home Equipment: Environmental consultant - 4 wheels;Bedside commode      Prior Function Level of Independence: Independent with assistive device(s)         Comments: patient reports she uses a rollator intermittently. daughter assist occasionally with ADLs if the patient is "having a bad day." otherwise patient is independent with mobility and ADLs, drvies occasionally     Hand Dominance   Dominant Hand: Right    Extremity/Trunk Assessment   Upper Extremity Assessment Upper Extremity Assessment: RUE deficits/detail;LUE deficits/detail RUE Deficits / Details: not formally MMT due to acuity of procedure. patient is able to achieve shoulder flexion to 90 degrees and sustain position for no more than 5 seconds before fatigue. elbow AROM WFL. good grip strength. endurance impaired for sustained activity RUE Sensation: decreased light touch (patient self reports numbness, however is able to detect light touch 5/5 correctly) LUE Deficits / Details: not formally MMT due to acuity of procedure. patient is able to achieve shoulder flexion to 90 degrees and sustain for more than 5 seconds before fatigue. elbow AROM WFL. good grip strength.  endurance impaired for sustained activity LUE Sensation:  (patient self reports numbness, however is able to detect light touch 5/5 correctly)    Lower Extremity Assessment Lower Extremity Assessment: Overall WFL for tasks assessed (5/5 strength BLE dorsiflexion/plantarflexion, hip add/abd, knee extension)       Communication   Communication: No difficulties  Cognition Arousal/Alertness: Awake/alert Behavior During Therapy: WFL for tasks assessed/performed Overall Cognitive Status: Within Functional Limits for tasks assessed                                 General Comments: patient is alert and oriented. she is able to follow single step commands consistently      General Comments General comments (skin integrity, edema, etc.): blood pressure taken multiple times during session and vitals monitored throughout. patient and daughter educated on general mobility precautions following surgery and positioning of UE with pillows in seated position for pain management. also educated patient on scheduled walking for upright conditioning at home.    Exercises     Assessment/Plan    PT Assessment Patient needs continued PT services  PT Problem List Decreased strength;Decreased activity tolerance;Decreased range of motion;Decreased balance;Decreased safety awareness;Pain       PT Treatment Interventions Gait training;DME instruction;Stair training;Functional mobility training;Therapeutic activities;Therapeutic exercise;Balance training;Neuromuscular re-education;Patient/family education    PT Goals (Current goals can be found in the Care Plan section)  Acute Rehab PT Goals Patient Stated Goal: to go home PT Goal Formulation: With patient Time For Goal Achievement: 12/26/20 Potential to Achieve Goals: Good    Frequency 7X/week   Barriers to  discharge        Co-evaluation               AM-PAC PT "6 Clicks" Mobility  Outcome Measure Help needed turning from  your back to your side while in a flat bed without using bedrails?: A Little Help needed moving from lying on your back to sitting on the side of a flat bed without using bedrails?: A Little Help needed moving to and from a bed to a chair (including a wheelchair)?: A Little Help needed standing up from a chair using your arms (e.g., wheelchair or bedside chair)?: A Little Help needed to walk in hospital room?: A Little Help needed climbing 3-5 steps with a railing? : A Little 6 Click Score: 18    End of Session Equipment Utilized During Treatment: Gait belt Activity Tolerance: Patient tolerated treatment well Patient left: in chair;with call bell/phone within reach;with family/visitor present Nurse Communication: Mobility status PT Visit Diagnosis: Unsteadiness on feet (R26.81);Muscle weakness (generalized) (M62.81)    Time: 1110-1145 PT Time Calculation (min) (ACUTE ONLY): 35 min   Charges:   PT Evaluation $PT Eval Moderate Complexity: 1 Mod PT Treatments $Therapeutic Activity: 8-22 mins        Donna Bernard, PT, MPT   Ina Homes 12/12/2020, 12:21 PM

## 2020-12-12 NOTE — Progress Notes (Signed)
    Attending Progress Note  History: Abigail Huffman Is POD#1 from suboccipital craniectomy for Chiari malformation.  POD#1: Patient doing well with expected neck pain and some mild headache.  Physical Exam: Vitals:   12/12/20 0600 12/12/20 0700  BP: 138/83 132/75  Pulse: 71 77  Resp: 12 13  Temp:    SpO2: 97% 97%    AA Ox3 CNI  Strength:5/5 throughout  incision dry and intact with dressing in place.  Data:  No results for input(s): NA, K, CL, CO2, BUN, CREATININE, LABGLOM, GLUCOSE, CALCIUM in the last 168 hours. No results for input(s): AST, ALT, ALKPHOS in the last 168 hours.  Invalid input(s): TBILI   No results for input(s): WBC, HGB, HCT, PLT in the last 168 hours. No results for input(s): APTT, INR in the last 168 hours.         Assessment/Plan:  Abigail Huffman s/p double craniectomy and C1 laminectomy with duraplasty for Medicare malformation.  - mobilize; PT ordered.  - pain control with orals  - Transfer to floor; q4 h nurse checks - bowel regimen for constipation - remove foley - regular diet as tolerated  Manning Charity PA-C Department of Neurosurgery

## 2020-12-12 NOTE — Anesthesia Postprocedure Evaluation (Signed)
Anesthesia Post Note  Patient: Abigail Huffman  Procedure(s) Performed: SUBOCCIPITAL CRANIECTOMY, C1 LAMINECTOMY, DURAPLASTY  Patient location during evaluation: SICU Anesthesia Type: General Level of consciousness: awake Pain management: pain level controlled Vital Signs Assessment: post-procedure vital signs reviewed and stable Respiratory status: spontaneous breathing Cardiovascular status: stable Postop Assessment: no apparent nausea or vomiting Anesthetic complications: no   No notable events documented.   Last Vitals:  Vitals:   12/12/20 0900 12/12/20 1000  BP: 140/88 128/73  Pulse: 75 71  Resp: 20 16  Temp:    SpO2: 99% 96%    Last Pain:  Vitals:   12/12/20 0911  TempSrc:   PainSc: 7                  Joany Khatib B Alonza Smoker

## 2020-12-13 MED ORDER — NALOXONE HCL 0.4 MG/ML IJ SOLN: 0.4000 mg | INTRAMUSCULAR | Status: DC | PRN

## 2020-12-13 MED ORDER — ENOXAPARIN SODIUM 40 MG/0.4ML IJ SOSY
40.0000 mg | PREFILLED_SYRINGE | INTRAMUSCULAR | Status: DC
  Administered 2020-12-13: 40 mg via SUBCUTANEOUS
  Filled 2020-12-13: qty 0.4

## 2020-12-13 MED ORDER — DIAZEPAM 2 MG PO TABS: 1.0000 mg | ORAL_TABLET | Freq: Three times a day (TID) | ORAL | Status: DC | PRN

## 2020-12-13 MED ORDER — DIAZEPAM 1 MG/ML PO SOLN: 1.0000 mg | Freq: Three times a day (TID) | ORAL | Status: DC | PRN

## 2020-12-13 MED ORDER — DIAZEPAM 5 MG PO TABS
5.0000 mg | ORAL_TABLET | Freq: Three times a day (TID) | ORAL | Status: DC | PRN
  Administered 2020-12-13: 5 mg via ORAL
  Filled 2020-12-13: qty 1

## 2020-12-13 NOTE — Plan of Care (Signed)
Patient is POD#2 s/p craniectomy, C1 laminectomy, and duraplasty. Pt is alert and oriented, moving all extremities with equal strength and sensation. Pt c/o normal post-op pain, which is getting better. Up to [facility] with assistance of family that is at bedside. Possible d/c if patient is able to have a BM.

## 2020-12-13 NOTE — Progress Notes (Signed)
Physical Therapy Treatment Patient Details Name: Abigail Huffman MRN: [id number] DOB: 05-04-1960 Today's Date: 12/13/2020   History of Present Illness Patient is a 60 year old female found to have symptoms and imaging consistent with a Chiari I malformation. Patient underwent double craniectomy and C1 laminectomy with duraplasty on 12/11/20.    PT Comments    Pt was long sitting in bed upon arriving. She agrees to session and was cooperative and pleasant throughout. Supportive daughter at bedside and assisted during session. Pt was easily able to exit bed, stand, ambulate, and perform stair training without difficulty or safety concern. RN in room at conclusion of session. PT recommend HHPT at DC to continue to progress pt to PLOF.   Recommendations for follow up therapy are one component of a multi-disciplinary discharge planning process, led by the attending physician.  Recommendations may be updated based on patient status, additional functional criteria and insurance authorization.  Follow Up Recommendations  Home health PT     Equipment Recommendations  None recommended by PT       Precautions / Restrictions Precautions Precautions: Fall Restrictions Weight Bearing Restrictions: No     Mobility  Bed Mobility Overal bed mobility: Needs Assistance Bed Mobility: Supine to Sit     Supine to sit: Supervision     Transfers Overall transfer level: Needs assistance Equipment used: Rolling walker (2 wheeled) Transfers: Sit to/from Stand Sit to Stand: Min guard;Supervision    Ambulation/Gait Ambulation/Gait assistance: Supervision Gait Distance (Feet): 200 Feet Assistive device: Rolling walker (2 wheeled) Gait Pattern/deviations: Step-through pattern Gait velocity: decreased   General Gait Details: Pt demonstratedsafe abilityy to ambulate 200 ft with RW without LOB or safety concerns. HR did eloevated to 120s and sao2 > 90% o rm air   Stairs Stairs: Yes Stairs  assistance: Min guard Stair Management: One rail Right;Forwards Number of Stairs: 5 General stair comments: Pt was able to safely ascend/descend 5 stair without difficulty     Balance Overall balance assessment: Needs assistance Sitting-balance support: Feet supported;No upper extremity supported Sitting balance-Leahy Scale: Good     Standing balance support: Bilateral upper extremity supported Standing balance-Leahy Scale: Good      Cognition Arousal/Alertness: Awake/alert Behavior During Therapy: WFL for tasks assessed/performed Overall Cognitive Status: Within Functional Limits for tasks assessed      General Comments: Pt is A and O x 4 and cooperative and motivated throughout             Pertinent Vitals/Pain Pain Assessment: 0-10 Pain Score: 3  Pain Location: posterior neck Pain Descriptors / Indicators: Guarding Pain Intervention(s): Limited activity within patient's tolerance;Monitored during session;Repositioned;Premedicated before session     PT Goals (current goals can now be found in the care plan section) Acute Rehab PT Goals Patient Stated Goal: to go home Progress towards PT goals: Progressing toward goals    Frequency    7X/week      PT Plan Current plan remains appropriate       AM-PAC PT "6 Clicks" Mobility   Outcome Measure  Help needed turning from your back to your side while in a flat bed without using bedrails?: A Little Help needed moving from lying on your back to sitting on the side of a flat bed without using bedrails?: A Little Help needed moving to and from a bed to a chair (including a wheelchair)?: A Little Help needed standing up from a chair using your arms (e.g., wheelchair or bedside chair)?: A Little Help needed to  walk in hospital room?: A Little Help needed climbing 3-5 steps with a railing? : A Little 6 Click Score: 18    End of Session Equipment Utilized During Treatment: Gait belt Activity Tolerance: Patient  tolerated treatment well Patient left: in bed;with nursing/sitter in room Nurse Communication: Mobility status PT Visit Diagnosis: Unsteadiness on feet (R26.81);Muscle weakness (generalized) (M62.81)     Time: 1355-1410 PT Time Calculation (min) (ACUTE ONLY): 15 min  Charges:  $Gait Training: 8-22 mins                     Jetta Lout PTA 12/13/20, 4:46 PM

## 2020-12-13 NOTE — Progress Notes (Signed)
    Attending Progress Note  History: Abigail Huffman Is POD#1 from suboccipital craniectomy for Chiari malformation.  POD#2: Continues to have some neck pain but reports improvement from POD#1.   Physical Exam: Vitals:   12/13/20 0500 12/13/20 0600  BP: (!) 147/76   Pulse:    Resp: 19 16  Temp:    SpO2:      AA Ox3 CNI  Strength:5/5 throughout  incision dry and intact with dressing in place.  Data:  No results for input(s): NA, K, CL, CO2, BUN, CREATININE, LABGLOM, GLUCOSE, CALCIUM in the last 168 hours. No results for input(s): AST, ALT, ALKPHOS in the last 168 hours.  Invalid input(s): TBILI   No results for input(s): WBC, HGB, HCT, PLT in the last 168 hours. No results for input(s): APTT, INR in the last 168 hours.         Assessment/Plan:  Abigail Huffman s/p double craniectomy and C1 laminectomy with duraplasty for Medicare malformation.  - mobilize; PT ordered.  - pain control with orals. Will try Valium today for muscle spasms - Transfer to floor; q4 h nurse checks - bowel regimen for constipation - remove foley - regular diet as tolerated  Manning Charity PA-C Department of Neurosurgery

## 2020-12-13 NOTE — Discharge Instructions (Addendum)
NEUROSURGERY DISCHARGE INSTRUCTIONS  Admission diagnosis: 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]  Operative procedure: Suboccipital craniectomy and C1 laminectomy for decompression  What to do after you leave the hospital:  Recommended diet: regular diet. Increase protein intake to promote wound healing.  Recommended activity: no lifting, driving, or strenuous exercise for 4 weeks .You should walk multiple times per day. Avoid any activity that would cause straining.  Special Instructions  No straining, no heavy lifting > 10lbs x 4 weeks.  Keep incision area clean and dry. May shower in 2 days. Do not let water directly hit your incision. It is ok for mild soap and water to run over incision. Carefully pat dry after shower. No baths or pools for 6 weeks.  Please remove dressing tomorrow, no need to apply a bandage afterwards.  Please keep head of bed 30 degrees or higher at all times.  You have sutures or staples that will be removed in clinic.   Please take pain medications as directed. Take a stool softener if on pain medications   Please Report any of the following: Nausea or Vomiting, Temperature is greater than 101.67F (38.1C) degrees, Dizziness, Abdominal Pain, Difficulty Breathing or Shortness of Breath, Inability to Eat, drink Fluids, or Take medications, Bleeding, swelling, or drainage from surgical incision sites, New numbness or weakness, and Bowel or bladder dysfunction to the neurosurgeon on call at [phone]  Additional Follow up appointments Please follow up with Manning Charity PA-C in [facility] as scheduled in 2-3 weeks   Please see below for scheduled appointments:  No future appointments.

## 2020-12-13 NOTE — Progress Notes (Signed)
At beginning of shift during assessment, found that both of patient's IVs infiltrated- tender, swollen, no blood return. Both IVs removed. No veins seen to place new IV. Contacted neurosurgeon PA to ask if the plan was to D/C today, as that is what the patient understood. Possible d/c early-mid afternoon. Per PA, it is ok for patient to not have an IV as she is tolerating PO intake well. If patient is not discharged per plan, will consult IV team to have another IV placed.

## 2020-12-14 MED ORDER — OXYCODONE HCL 10 MG PO TABS: 5.0000 mg | ORAL_TABLET | ORAL | 0 refills | Status: AC | PRN

## 2020-12-14 MED ORDER — CYCLOBENZAPRINE HCL 10 MG PO TABS: 10.0000 mg | ORAL_TABLET | Freq: Three times a day (TID) | ORAL | 0 refills | Status: DC

## 2020-12-14 MED ORDER — SENNA 8.6 MG PO TABS: 1.0000 | ORAL_TABLET | Freq: Two times a day (BID) | ORAL | 0 refills | Status: DC

## 2020-12-14 MED ORDER — BUTALBITAL-APAP-CAFFEINE 50-325-40 MG PO TABS: 1.0000 | ORAL_TABLET | Freq: Three times a day (TID) | ORAL | 0 refills | Status: AC | PRN

## 2020-12-14 NOTE — Discharge Summary (Signed)
Physician Discharge Summary  Patient ID: Abigail Huffman MRN: [id number] DOB/AGE: [birth year] 60 y.o.  Admit date: 12/11/2020 Discharge date: 12/14/2020  Admission Diagnoses: Chiari malformation  Discharge Diagnoses:  Active Problems:   Arnold-Chiari syndrome [facility])   Discharged Condition: fair  Hospital Course: Abigail Huffman is a 60 y.o admitted after suboccipital craniectomy and C1 laminectomy for Chiari malformation.  Her intraoperative course was uncomplicated and she was admitted for monitoring to the ICU postoperatively.  She was given pain medication and participated with physical therapy and ultimately her pain was controlled with oral medications and antispasmodics.  She was able to ambulate, urinate, and tolerate oral intake. Her incision was clean dry and intact without evidence of drainage.  Consults: None  Significant Diagnostic Studies: none  Treatments: surgery: See above  Discharge Exam: Blood pressure 119/75, pulse 73, temperature (!) 97.3 F (36.3 C), temperature source Oral, resp. rate 18, height 5\' 3"  (1.6 m), weight 99 kg, last menstrual period 09/19/1988, SpO2 98 %. AA Ox3 CNI Strength:5/5 throughout  incision dry and intact with dressing in place.  Disposition: Discharge disposition: 01-Home or Self Care       Discharge Instructions     Diet - low sodium heart healthy   Complete by: As directed    Increase activity slowly   Complete by: As directed    Remove dressing in 24 hours   Complete by: As directed       Allergies as of 12/14/2020       Reactions   Gabapentin Hives   Nsaids    Avoid due to stomach ulcers    Pregabalin Other (See Comments)   Hematuria   Aspirin Rash        Medication List     TAKE these medications    albuterol (2.5 MG/3ML) 0.083% nebulizer solution Commonly known as: PROVENTIL Take 2.5 mg by nebulization every 6 (six) hours as needed for wheezing or shortness of breath.   albuterol 108 (90 Base) MCG/ACT  inhaler Commonly known as: VENTOLIN HFA Inhale 2 puffs into the lungs every 6 (six) hours as needed for shortness of breath or wheezing.   ALPRAZolam 0.25 MG tablet Commonly known as: XANAX Take 0.25 mg by mouth at bedtime as needed for sleep.   beclomethasone 40 MCG/ACT inhaler Commonly known as: QVAR Inhale 2 puffs into the lungs 2 (two) times daily as needed (shortness of breath).   butalbital-acetaminophen-caffeine 50-325-40 MG tablet Commonly known as: FIORICET Take 1 tablet by mouth 3 (three) times daily as needed for up to 5 days for headache.   cetirizine 10 MG tablet Commonly known as: ZYRTEC Take 10 mg by mouth daily.   cyclobenzaprine 10 MG tablet Commonly known as: FLEXERIL Take 1 tablet (10 mg total) by mouth 3 (three) times daily.   esomeprazole 20 MG capsule Commonly known as: NEXIUM Take 20 mg by mouth daily as needed (acid reflux).   hydrochlorothiazide 25 MG tablet Commonly known as: HYDRODIURIL Take 25 mg by mouth daily.   multivitamin with minerals Tabs tablet Take 1 tablet by mouth daily.   Oxycodone HCl 10 MG Tabs Take 0.5-1 tablets (5-10 mg total) by mouth every 4 (four) hours as needed for up to 5 days for severe pain.   PARoxetine Mesylate 7.5 MG Caps Take 7.5 mg by mouth daily.   pyridOXINE 50 MG tablet Commonly known as: VITAMIN B-6 Take 50 mg by mouth daily.   senna 8.6 MG Tabs tablet Commonly known as: SENOKOT Take 1 tablet (  8.6 mg total) by mouth 2 (two) times daily.   traZODone 50 MG tablet Commonly known as: DESYREL Take 50 mg by mouth at bedtime as needed for sleep.        Follow-up Information     Susanne Borders, PA Follow up in 2 week(s).   Why: for staple removal Contact information: [address] [place] [address] [phone]                 Signed: Susanne Borders 12/14/2020, 9:36 AM

## 2020-12-14 NOTE — TOC Initial Note (Signed)
Transition of Care [facility]) - Initial/Assessment Note    Patient Details  Name: Abigail Huffman MRN: [id number] Date of Birth: 08/05/1960  Transition of Care [facility]) CM/SW Contact:    Abigail Huffman Phone Number: [phone] 12/14/2020, 2:30 PM  Clinical Narrative:                  Patient will d/c home with out patient PT.  Patient A/O X4 and has family supports.  Patient agrees with out patient PT.  CSW will send referral to [facility] outpatient PT. Attending/Unit RN updated.  Expected Discharge Plan: Home w Home Health Services Barriers to Discharge: No Barriers Identified   Patient Goals and CMS Choice        Expected Discharge Plan and Services Expected Discharge Plan: Home w Home Health Services In-house Referral: Clinical Social Work   Post Acute Care Choice:  (Out Patient PT) Living arrangements for the past 2 months: Single Family Home Expected Discharge Date: 12/14/20                                    Prior Living Arrangements/Services Living arrangements for the past 2 months: Single Family Home Lives with:: Spouse, Adult Children Patient language and need for interpreter reviewed:: Yes Do you feel safe going back to the place where you live?: Yes      Need for Family Participation in Patient Care: Yes (Comment) Care giver support system in place?: Yes (comment)   Criminal Activity/Legal Involvement Pertinent to Current Situation/Hospitalization: No - Comment as needed  Activities of Daily Living      Permission Sought/Granted Permission sought to share information with : Family Supports    Share Information with NAME: Abigail, Huffman (Spouse)   [phone] (Home Phone)           Emotional Assessment Appearance:: Appears stated age Attitude/Demeanor/Rapport: Engaged Affect (typically observed): Pleasant Orientation: : Oriented to Self, Oriented to Place, Oriented to  Time, Oriented to Situation Alcohol / Substance Use: Other (comment) Psych  Involvement: No (comment)  Admission diagnosis:  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] Patient Active Problem List   Diagnosis Date Noted   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 Jerilynn M[place]) [place] [address] Phone: [phone] Fax: [phone]     Social Determinants of Health (SDOH) Interventions    Readmission Risk Interventions No flowsheet data found.

## 2020-12-14 NOTE — Progress Notes (Signed)
    Attending Progress Note  History: Abigail Huffman Is POD#3 from suboccipital craniectomy for Chiari malformation.  POD#3: Reports improvement of her pain overnight.  She is worked well with physical therapy.  She is ready to go home.  Physical Exam: Vitals:   12/14/20 0600 12/14/20 0800  BP: 123/80 119/75  Pulse:    Resp: 18 18  Temp:  (!) 97.3 F (36.3 C)  SpO2:  98%    AA Ox3 CNI Strength:5/5 throughout  incision dry and intact with dressing in place.  Data:  No results for input(s): NA, K, CL, CO2, BUN, CREATININE, LABGLOM, GLUCOSE, CALCIUM in the last 168 hours. No results for input(s): AST, ALT, ALKPHOS in the last 168 hours.  Invalid input(s): TBILI   No results for input(s): WBC, HGB, HCT, PLT in the last 168 hours. No results for input(s): APTT, INR in the last 168 hours.         Assessment/Plan:  Abigail Huffman s/p double craniectomy and C1 laminectomy with duraplasty for Medicare malformation.  - mobilize; PT recommending home health at discharge.  Social work was consulted to help with arranging this. - pain control with orals.  I am providing additional relief of muscle spasms. - bowel regimen for constipation - regular diet as tolerated  Manning Charity PA-C Department of Neurosurgery

## 2020-12-21 ENCOUNTER — Encounter: Payer: Self-pay | Admitting: Neurosurgery

## 2021-08-10 ENCOUNTER — Other Ambulatory Visit: Payer: Self-pay | Admitting: Infectious Diseases

## 2021-08-10 DIAGNOSIS — Z1231 Encounter for screening mammogram for malignant neoplasm of breast: Secondary | ICD-10-CM

## 2021-10-15 ENCOUNTER — Ambulatory Visit
Admission: RE | Admit: 2021-10-15 | Discharge: 2021-10-15 | Disposition: A | Discharge status: Home / Self Care | Payer: 59 | Source: Ambulatory Visit | Attending: Infectious Diseases | Admitting: Infectious Diseases

## 2021-10-15 DIAGNOSIS — Z1231 Encounter for screening mammogram for malignant neoplasm of breast: Secondary | ICD-10-CM | POA: Insufficient documentation

## 2022-07-04 DIAGNOSIS — G4700 Insomnia, unspecified: Secondary | ICD-10-CM | POA: Diagnosis not present

## 2022-07-04 DIAGNOSIS — Q0700 Arnold-Chiari syndrome without spina bifida or hydrocephalus: Secondary | ICD-10-CM | POA: Diagnosis not present

## 2022-07-04 DIAGNOSIS — R7303 Prediabetes: Secondary | ICD-10-CM | POA: Diagnosis not present

## 2022-07-04 DIAGNOSIS — M79662 Pain in left lower leg: Secondary | ICD-10-CM | POA: Diagnosis not present

## 2022-07-04 DIAGNOSIS — F32A Depression, unspecified: Secondary | ICD-10-CM | POA: Diagnosis not present

## 2022-07-04 DIAGNOSIS — K219 Gastro-esophageal reflux disease without esophagitis: Secondary | ICD-10-CM | POA: Diagnosis not present

## 2022-07-04 DIAGNOSIS — J4540 Moderate persistent asthma, uncomplicated: Secondary | ICD-10-CM | POA: Diagnosis not present

## 2022-07-04 DIAGNOSIS — I10 Essential (primary) hypertension: Secondary | ICD-10-CM | POA: Diagnosis not present

## 2022-07-09 ENCOUNTER — Encounter: Payer: Self-pay | Admitting: Infectious Diseases

## 2022-07-09 ENCOUNTER — Other Ambulatory Visit: Payer: Self-pay | Admitting: Infectious Diseases

## 2022-07-09 DIAGNOSIS — M79662 Pain in left lower leg: Secondary | ICD-10-CM

## 2022-07-10 ENCOUNTER — Ambulatory Visit
Admission: RE | Admit: 2022-07-10 | Discharge: 2022-07-10 | Disposition: A | Discharge status: Home / Self Care | Payer: 59 | Source: Ambulatory Visit | Attending: Infectious Diseases | Admitting: Infectious Diseases

## 2022-07-10 DIAGNOSIS — M79662 Pain in left lower leg: Secondary | ICD-10-CM

## 2022-07-10 DIAGNOSIS — R600 Localized edema: Secondary | ICD-10-CM | POA: Diagnosis not present

## 2022-07-18 DIAGNOSIS — R946 Abnormal results of thyroid function studies: Secondary | ICD-10-CM | POA: Diagnosis not present

## 2022-08-19 ENCOUNTER — Other Ambulatory Visit: Payer: Self-pay

## 2022-09-12 DIAGNOSIS — M7542 Impingement syndrome of left shoulder: Secondary | ICD-10-CM | POA: Diagnosis not present

## 2022-09-12 DIAGNOSIS — M19012 Primary osteoarthritis, left shoulder: Secondary | ICD-10-CM | POA: Diagnosis not present

## 2022-09-12 DIAGNOSIS — F32A Depression, unspecified: Secondary | ICD-10-CM | POA: Diagnosis not present

## 2022-10-29 DIAGNOSIS — R0781 Pleurodynia: Secondary | ICD-10-CM | POA: Diagnosis not present

## 2022-10-29 DIAGNOSIS — M25512 Pain in left shoulder: Secondary | ICD-10-CM | POA: Diagnosis not present

## 2022-10-29 DIAGNOSIS — M549 Dorsalgia, unspecified: Secondary | ICD-10-CM | POA: Diagnosis not present

## 2022-10-30 ENCOUNTER — Other Ambulatory Visit: Payer: Self-pay | Admitting: Student

## 2022-10-30 DIAGNOSIS — M7522 Bicipital tendinitis, left shoulder: Secondary | ICD-10-CM

## 2022-10-30 DIAGNOSIS — G8929 Other chronic pain: Secondary | ICD-10-CM | POA: Diagnosis not present

## 2022-10-30 DIAGNOSIS — M7582 Other shoulder lesions, left shoulder: Secondary | ICD-10-CM

## 2022-10-30 DIAGNOSIS — M25512 Pain in left shoulder: Secondary | ICD-10-CM | POA: Diagnosis not present

## 2022-11-11 ENCOUNTER — Encounter: Payer: Self-pay | Admitting: Student

## 2022-11-13 ENCOUNTER — Encounter: Payer: Self-pay | Admitting: Student

## 2022-11-14 ENCOUNTER — Other Ambulatory Visit: Payer: 59

## 2023-01-20 DIAGNOSIS — J4540 Moderate persistent asthma, uncomplicated: Secondary | ICD-10-CM | POA: Diagnosis not present

## 2023-01-20 DIAGNOSIS — R7989 Other specified abnormal findings of blood chemistry: Secondary | ICD-10-CM | POA: Diagnosis not present

## 2023-01-20 DIAGNOSIS — N3941 Urge incontinence: Secondary | ICD-10-CM | POA: Diagnosis not present

## 2023-01-20 DIAGNOSIS — I10 Essential (primary) hypertension: Secondary | ICD-10-CM | POA: Diagnosis not present

## 2023-01-20 DIAGNOSIS — R7303 Prediabetes: Secondary | ICD-10-CM | POA: Diagnosis not present

## 2023-01-20 DIAGNOSIS — R232 Flushing: Secondary | ICD-10-CM | POA: Diagnosis not present

## 2023-01-20 DIAGNOSIS — Z23 Encounter for immunization: Secondary | ICD-10-CM | POA: Diagnosis not present

## 2023-01-20 DIAGNOSIS — F32A Depression, unspecified: Secondary | ICD-10-CM | POA: Diagnosis not present

## 2023-01-20 DIAGNOSIS — K219 Gastro-esophageal reflux disease without esophagitis: Secondary | ICD-10-CM | POA: Diagnosis not present

## 2023-01-20 DIAGNOSIS — Q0700 Arnold-Chiari syndrome without spina bifida or hydrocephalus: Secondary | ICD-10-CM | POA: Diagnosis not present

## 2023-01-20 DIAGNOSIS — Z7189 Other specified counseling: Secondary | ICD-10-CM | POA: Diagnosis not present

## 2023-01-20 DIAGNOSIS — M7542 Impingement syndrome of left shoulder: Secondary | ICD-10-CM | POA: Diagnosis not present

## 2023-01-20 DIAGNOSIS — G4700 Insomnia, unspecified: Secondary | ICD-10-CM | POA: Diagnosis not present

## 2023-01-20 DIAGNOSIS — Z0000 Encounter for general adult medical examination without abnormal findings: Secondary | ICD-10-CM | POA: Diagnosis not present

## 2023-01-20 DIAGNOSIS — D649 Anemia, unspecified: Secondary | ICD-10-CM | POA: Diagnosis not present

## 2023-02-05 DIAGNOSIS — R1310 Dysphagia, unspecified: Secondary | ICD-10-CM | POA: Diagnosis not present

## 2023-02-05 DIAGNOSIS — E042 Nontoxic multinodular goiter: Secondary | ICD-10-CM | POA: Diagnosis not present

## 2023-02-05 DIAGNOSIS — E0590 Thyrotoxicosis, unspecified without thyrotoxic crisis or storm: Secondary | ICD-10-CM | POA: Diagnosis not present

## 2023-02-20 DIAGNOSIS — R7989 Other specified abnormal findings of blood chemistry: Secondary | ICD-10-CM | POA: Diagnosis not present

## 2023-02-20 DIAGNOSIS — K219 Gastro-esophageal reflux disease without esophagitis: Secondary | ICD-10-CM | POA: Diagnosis not present

## 2023-02-20 DIAGNOSIS — F32A Depression, unspecified: Secondary | ICD-10-CM | POA: Diagnosis not present

## 2023-02-20 DIAGNOSIS — E0590 Thyrotoxicosis, unspecified without thyrotoxic crisis or storm: Secondary | ICD-10-CM | POA: Diagnosis not present

## 2023-02-20 DIAGNOSIS — Q0700 Arnold-Chiari syndrome without spina bifida or hydrocephalus: Secondary | ICD-10-CM | POA: Diagnosis not present

## 2023-02-20 DIAGNOSIS — G4700 Insomnia, unspecified: Secondary | ICD-10-CM | POA: Diagnosis not present

## 2023-02-20 DIAGNOSIS — M7542 Impingement syndrome of left shoulder: Secondary | ICD-10-CM | POA: Diagnosis not present

## 2023-02-20 DIAGNOSIS — R1310 Dysphagia, unspecified: Secondary | ICD-10-CM | POA: Diagnosis not present

## 2023-02-20 DIAGNOSIS — E042 Nontoxic multinodular goiter: Secondary | ICD-10-CM | POA: Diagnosis not present

## 2023-02-20 DIAGNOSIS — I10 Essential (primary) hypertension: Secondary | ICD-10-CM | POA: Diagnosis not present

## 2023-02-20 DIAGNOSIS — R7303 Prediabetes: Secondary | ICD-10-CM | POA: Diagnosis not present

## 2023-03-04 ENCOUNTER — Encounter: Payer: Self-pay | Admitting: Infectious Diseases

## 2023-03-04 ENCOUNTER — Other Ambulatory Visit: Payer: Self-pay | Admitting: Infectious Diseases

## 2023-03-04 DIAGNOSIS — M7542 Impingement syndrome of left shoulder: Secondary | ICD-10-CM

## 2023-03-05 ENCOUNTER — Ambulatory Visit
Admission: RE | Admit: 2023-03-05 | Discharge: 2023-03-05 | Disposition: A | Discharge status: Home / Self Care | Payer: 59 | Source: Ambulatory Visit | Attending: Infectious Diseases | Admitting: Infectious Diseases

## 2023-03-05 DIAGNOSIS — M25512 Pain in left shoulder: Secondary | ICD-10-CM | POA: Diagnosis not present

## 2023-03-05 DIAGNOSIS — M7542 Impingement syndrome of left shoulder: Secondary | ICD-10-CM

## 2023-03-05 DIAGNOSIS — M19012 Primary osteoarthritis, left shoulder: Secondary | ICD-10-CM | POA: Diagnosis not present

## 2023-03-05 DIAGNOSIS — M7552 Bursitis of left shoulder: Secondary | ICD-10-CM | POA: Diagnosis not present

## 2023-03-05 DIAGNOSIS — M7582 Other shoulder lesions, left shoulder: Secondary | ICD-10-CM | POA: Diagnosis not present

## 2023-04-14 DIAGNOSIS — E042 Nontoxic multinodular goiter: Secondary | ICD-10-CM | POA: Diagnosis not present

## 2023-04-14 DIAGNOSIS — E0590 Thyrotoxicosis, unspecified without thyrotoxic crisis or storm: Secondary | ICD-10-CM | POA: Diagnosis not present

## 2023-04-30 DIAGNOSIS — G8929 Other chronic pain: Secondary | ICD-10-CM | POA: Diagnosis not present

## 2023-04-30 DIAGNOSIS — M19012 Primary osteoarthritis, left shoulder: Secondary | ICD-10-CM | POA: Diagnosis not present

## 2023-04-30 DIAGNOSIS — M7522 Bicipital tendinitis, left shoulder: Secondary | ICD-10-CM | POA: Diagnosis not present

## 2023-04-30 DIAGNOSIS — M25812 Other specified joint disorders, left shoulder: Secondary | ICD-10-CM | POA: Diagnosis not present

## 2023-04-30 DIAGNOSIS — M25512 Pain in left shoulder: Secondary | ICD-10-CM | POA: Diagnosis not present

## 2023-04-30 DIAGNOSIS — M75112 Incomplete rotator cuff tear or rupture of left shoulder, not specified as traumatic: Secondary | ICD-10-CM | POA: Diagnosis not present

## 2023-04-30 DIAGNOSIS — M7582 Other shoulder lesions, left shoulder: Secondary | ICD-10-CM | POA: Diagnosis not present

## 2023-05-20 ENCOUNTER — Other Ambulatory Visit: Payer: Self-pay | Admitting: Surgery

## 2023-05-28 ENCOUNTER — Encounter: Payer: Self-pay | Admitting: Surgery

## 2023-05-28 ENCOUNTER — Other Ambulatory Visit: Payer: Self-pay

## 2023-05-28 ENCOUNTER — Encounter
Admission: RE | Admit: 2023-05-28 | Discharge: 2023-05-28 | Disposition: A | Discharge status: Home / Self Care | Payer: 59 | Source: Ambulatory Visit | Attending: Surgery | Admitting: Surgery

## 2023-05-28 DIAGNOSIS — M7582 Other shoulder lesions, left shoulder: Secondary | ICD-10-CM | POA: Diagnosis not present

## 2023-05-28 DIAGNOSIS — G8929 Other chronic pain: Secondary | ICD-10-CM | POA: Diagnosis not present

## 2023-05-28 DIAGNOSIS — Z6835 Body mass index (BMI) 35.0-35.9, adult: Secondary | ICD-10-CM | POA: Diagnosis not present

## 2023-05-28 DIAGNOSIS — M75112 Incomplete rotator cuff tear or rupture of left shoulder, not specified as traumatic: Secondary | ICD-10-CM | POA: Diagnosis not present

## 2023-05-28 DIAGNOSIS — M7522 Bicipital tendinitis, left shoulder: Secondary | ICD-10-CM | POA: Diagnosis not present

## 2023-05-28 DIAGNOSIS — M25512 Pain in left shoulder: Secondary | ICD-10-CM | POA: Diagnosis not present

## 2023-05-28 HISTORY — DX: Other specified postprocedural states: Z98.890

## 2023-05-28 NOTE — Patient Instructions (Signed)
 Your procedure is scheduled on: Wednesday March 5  Report to the Registration Desk on the 1st floor of the [facility] To find out your arrival time, please call [phone] between 1PM - 3PM on: Tuesday March 4  If your arrival time is 6:00 am, do not arrive before that time as the [facility] entrance doors do not open until 6:00 am.  REMEMBER: Instructions that are not followed completely may result in serious medical risk, up to and including death; or upon the discretion of your surgeon and anesthesiologist your surgery may need to be rescheduled.  Do not eat food after midnight the night before surgery.  No gum chewing or hard candies.  You may however, drink CLEAR liquids up to 2 hours before you are scheduled to arrive for your surgery. Do not drink anything within 2 hours of your scheduled arrival time.  Clear liquids include: - water  - apple juice without pulp - gatorade (not RED colors) - black coffee or tea (Do NOT add milk or creamers to the coffee or tea) Do NOT drink anything that is not on this list.   In addition, your doctor has ordered for you to drink the provided:  Ensure Pre-Surgery Clear Carbohydrate Drink   Drinking this carbohydrate drink up to two hours before surgery helps to reduce insulin resistance and improve patient outcomes. Please complete drinking 2 hours before scheduled arrival time.  One week prior to surgery: Start Wednesday February 26  Stop Anti-inflammatories (NSAIDS) such as Advil, Aleve, Ibuprofen, Motrin, Naproxen, Naprosyn and Aspirin based products such as Excedrin, Goody's Powder, BC Powder. Stop ANY OVER THE COUNTER supplements until after surgery. Multiple Vitamin (MULTIVITAMIN WITH MINERALS)   You may however, continue to take Tylenol if needed for pain up until the day of surgery.  Continue taking all of your other prescription medications up until the day of surgery.  ON THE DAY OF SURGERY ONLY TAKE THESE MEDICATIONS WITH  SIPS OF WATER:  ARIPiprazole (ABILIFY)  busPIRone (BUSPAR)  celecoxib (CELEBREX)   Use inhalers on the day of surgery and bring to the hospital. albuterol (PROVENTIL HFA;VENTOLIN HFA)   No Alcohol for 24 hours before or after surgery.  No Smoking including e-cigarettes for 24 hours before surgery.  No chewable tobacco products for at least 6 hours before surgery.  No nicotine patches on the day of surgery.  Do not use any "recreational" drugs for at least a week (preferably 2 weeks) before your surgery.  Please be advised that the combination of cocaine and anesthesia may have negative outcomes, up to and including death. If you test positive for cocaine, your surgery will be cancelled.  On the morning of surgery brush your teeth with toothpaste and water, you may rinse your mouth with mouthwash if you wish. Do not swallow any toothpaste or mouthwash.  Use CHG Soap as directed on instruction sheet.  Do not wear jewelry, make-up, hairpins, clips or nail polish.  For welded (permanent) jewelry: bracelets, anklets, waist bands, etc.  Please have this removed prior to surgery.  If it is not removed, there is a chance that hospital personnel will need to cut it off on the day of surgery.  Do not wear lotions, powders, or perfumes.   Do not shave body hair from the neck down 48 hours before surgery.  Contact lenses, hearing aids and dentures may not be worn into surgery.  Do not bring valuables to the hospital. [facility] is not responsible for  any missing/lost belongings or valuables.   Notify your doctor if there is any change in your medical condition (cold, fever, infection).  Wear comfortable clothing (specific to your surgery type) to the hospital.  After surgery, you can help prevent lung complications by doing breathing exercises.  Take deep breaths and cough every 1-2 hours. Your doctor may order a device called an Incentive Spirometer to help you take deep breaths.  If  you are being discharged the day of surgery, you will not be allowed to drive home. You will need a responsible individual to drive you home and stay with you for 24 hours after surgery.   If you are taking public transportation, you will need to have a responsible individual with you.  Please call the Pre-admissions Testing Dept. at [phone] if you have any questions about these instructions.  Surgery Visitation Policy:  Patients having surgery or a procedure may have two visitors.  Children under the age of 60 must have an adult with them who is not the patient.  Temporary Visitor Restrictions Due to increasing cases of flu, RSV and COVID-19: Children ages 96 and under will not be able to visit patients in [facility] hospitals under most circumstances.   Visiting hours are 7 a.m. to 8 p.m. Up to four visitors are allowed at one time in a patient room. The visitors may rotate out with other people during the day.  One visitor age 89 or older may stay with the patient overnight and must be in the room by 8 p.m.     How to Use an Incentive Spirometer  An incentive spirometer is a tool that measures how well you are filling your lungs with each breath. Learning to take long, deep breaths using this tool can help you keep your lungs clear and active. This may help to reverse or lessen your chance of developing breathing (pulmonary) problems, especially infection. You may be asked to use a spirometer: After a surgery. If you have a lung problem or a history of smoking. After a long period of time when you have been unable to move or be active. If the spirometer includes an indicator to show the highest number that you have reached, your health care provider or respiratory therapist will help you set a goal. Keep a log of your progress as told by your health care provider. What are the risks? Breathing too quickly may cause dizziness or cause you to pass out. Take your time so you  do not get dizzy or light-headed. If you are in pain, you may need to take pain medicine before doing incentive spirometry. It is harder to take a deep breath if you are having pain. How to use your incentive spirometer  Sit up on the edge of your bed or on a chair. Hold the incentive spirometer so that it is in an upright position. Before you use the spirometer, breathe out normally. Place the mouthpiece in your mouth. Make sure your lips are closed tightly around it. Breathe in slowly and as deeply as you can through your mouth, causing the piston or the ball to rise toward the top of the chamber. Hold your breath for 3-5 seconds, or for as long as possible. If the spirometer includes a coach indicator, use this to guide you in breathing. Slow down your breathing if the indicator goes above the marked areas. Remove the mouthpiece from your mouth and breathe out normally. The piston or ball will return  to the bottom of the chamber. Rest for a few seconds, then repeat the steps 10 or more times. Take your time and take a few normal breaths between deep breaths so that you do not get dizzy or light-headed. Do this every 1-2 hours when you are awake. If the spirometer includes a goal marker to show the highest number you have reached (best effort), use this as a goal to work toward during each repetition. After each set of 10 deep breaths, cough a few times. This will help to make sure that your lungs are clear. If you have an incision on your chest or abdomen from surgery, place a pillow or a rolled-up towel firmly against the incision when you cough. This can help to reduce pain while taking deep breaths and coughing. General tips When you are able to get out of bed: Walk around often. Continue to take deep breaths and cough in order to clear your lungs. Keep using the incentive spirometer until your health care provider says it is okay to stop using it. If you have been in the hospital, you  may be told to keep using the spirometer at home. Contact a health care provider if: You are having difficulty using the spirometer. You have trouble using the spirometer as often as instructed. Your pain medicine is not giving enough relief for you to use the spirometer as told. You have a fever. Get help right away if: You develop shortness of breath. You develop a cough with bloody mucus from the lungs. You have fluid or blood coming from an incision site after you cough. Summary An incentive spirometer is a tool that can help you learn to take long, deep breaths to keep your lungs clear and active. You may be asked to use a spirometer after a surgery, if you have a lung problem or a history of smoking, or if you have been inactive for a long period of time. Use your incentive spirometer as instructed every 1-2 hours while you are awake. If you have an incision on your chest or abdomen, place a pillow or a rolled-up towel firmly against your incision when you cough. This will help to reduce pain. Get help right away if you have shortness of breath, you cough up bloody mucus, or blood comes from your incision when you cough. This information is not intended to replace advice given to you by your health care provider. Make sure you discuss any questions you have with your health care provider. Document Revised: 06/07/2019 Document Reviewed: 06/07/2019 Elsevier Patient Education  2023 Else[employer] Inc.     Preparing for Surgery with CHLORHEXIDINE GLUCONATE (CHG) Soap  Chlorhexidine Gluconate (CHG) Soap  o An antiseptic cleaner that kills germs and bonds with the skin to continue killing germs even after washing  o Used for showering the night before surgery and morning of surgery  Before surgery, you can play an important role by reducing the number of germs on your skin.  CHG (Chlorhexidine gluconate) soap is an antiseptic cleanser which kills germs and bonds with the skin to continue  killing germs even after washing.  Please do not use if you have an allergy to CHG or antibacterial soaps. If your skin becomes reddened/irritated stop using the CHG.  1. Shower the NIGHT BEFORE SURGERY and the MORNING OF SURGERY with CHG soap.  2. If you choose to wash your hair, wash your hair first as usual with your normal shampoo.  3. After shampooing, rinse  your hair and body thoroughly to remove the shampoo.  4. Use CHG as you would any other liquid soap. You can apply CHG directly to the skin and wash gently with a scrungie or a clean washcloth.  5. Apply the CHG soap to your body only from the neck down. Do not use on open wounds or open sores. Avoid contact with your eyes, ears, mouth, and genitals (private parts). Wash face and genitals (private parts) with your normal soap.  6. Wash thoroughly, paying special attention to the area where your surgery will be performed.  7. Thoroughly rinse your body with warm water.  8. Do not shower/wash with your normal soap after using and rinsing off the CHG soap.  9. Pat yourself dry with a clean towel.  10. Wear clean pajamas to bed the night before surgery.  11. Place clean sheets on your bed the night of your first shower and do not sleep with pets.  12. Shower again with the CHG soap on the day of surgery prior to arriving at the hospital.  13. Do not apply any deodorants/lotions/powders.  14. Please wear clean clothes to the hospital.

## 2023-05-29 ENCOUNTER — Encounter
Admission: RE | Admit: 2023-05-29 | Discharge: 2023-05-29 | Disposition: A | Discharge status: Home / Self Care | Payer: 59 | Source: Ambulatory Visit | Attending: Surgery | Admitting: Surgery

## 2023-05-29 DIAGNOSIS — I10 Essential (primary) hypertension: Secondary | ICD-10-CM | POA: Insufficient documentation

## 2023-05-29 DIAGNOSIS — Z01812 Encounter for preprocedural laboratory examination: Secondary | ICD-10-CM

## 2023-05-29 DIAGNOSIS — Z01810 Encounter for preprocedural cardiovascular examination: Secondary | ICD-10-CM

## 2023-05-29 DIAGNOSIS — E0590 Thyrotoxicosis, unspecified without thyrotoxic crisis or storm: Secondary | ICD-10-CM | POA: Insufficient documentation

## 2023-05-29 DIAGNOSIS — Z01818 Encounter for other preprocedural examination: Secondary | ICD-10-CM | POA: Insufficient documentation

## 2023-05-29 LAB — URINALYSIS, COMPLETE (UACMP) WITH MICROSCOPIC
Bilirubin Urine: NEGATIVE
Glucose, UA: NEGATIVE mg/dL
Ketones, ur: NEGATIVE mg/dL
Leukocytes,Ua: NEGATIVE
Nitrite: NEGATIVE
Protein, ur: NEGATIVE mg/dL
Specific Gravity, Urine: 1.011 (ref 1.005–1.030)
pH: 5 (ref 5.0–8.0)

## 2023-05-29 LAB — BASIC METABOLIC PANEL
Anion gap: 7 (ref 5–15)
BUN: 11 mg/dL (ref 8–23)
CO2: 27 mmol/L (ref 22–32)
Calcium: 9 mg/dL (ref 8.9–10.3)
Chloride: 104 mmol/L (ref 98–111)
Creatinine, Ser: 0.89 mg/dL (ref 0.44–1.00)
GFR, Estimated: >60 mL/min (ref >60)
Glucose, Bld: 80 mg/dL (ref 70–99)
Potassium: 3.4 mmol/L — ABNORMAL LOW (ref 3.5–5.1)
Sodium: 138 mmol/L (ref 135–145)

## 2023-05-29 LAB — CBC
HCT: 37.6 % (ref 36.0–46.0)
Hemoglobin: 12 g/dL (ref 12.0–15.0)
MCH: 27.6 pg (ref 26.0–34.0)
MCHC: 31.9 g/dL (ref 30.0–36.0)
MCV: 86.4 fL (ref 80.0–100.0)
Platelets: 362 10*3/uL (ref 150–400)
RBC: 4.35 MIL/uL (ref 3.87–5.11)
RDW: 14.4 % (ref 11.5–15.5)
WBC: 6 10*3/uL (ref 4.0–10.5)
nRBC: 0 % (ref 0.0–0.2)

## 2023-06-04 ENCOUNTER — Encounter
Admission: RE | Disposition: A | Discharge status: Home / Self Care | Payer: Self-pay | Source: Home / Self Care | Attending: Surgery

## 2023-06-04 ENCOUNTER — Ambulatory Visit
Admission: RE | Admit: 2023-06-04 | Discharge: 2023-06-04 | Disposition: A | Discharge status: Home / Self Care | Payer: 59 | Attending: Surgery | Admitting: Surgery

## 2023-06-04 ENCOUNTER — Other Ambulatory Visit: Payer: Self-pay

## 2023-06-04 ENCOUNTER — Ambulatory Visit

## 2023-06-04 ENCOUNTER — Ambulatory Visit: Payer: Self-pay | Admitting: Urgent Care

## 2023-06-04 ENCOUNTER — Encounter: Payer: Self-pay | Admitting: Surgery

## 2023-06-04 DIAGNOSIS — Z8249 Family history of ischemic heart disease and other diseases of the circulatory system: Secondary | ICD-10-CM | POA: Insufficient documentation

## 2023-06-04 DIAGNOSIS — M25812 Other specified joint disorders, left shoulder: Secondary | ICD-10-CM | POA: Diagnosis not present

## 2023-06-04 DIAGNOSIS — I10 Essential (primary) hypertension: Secondary | ICD-10-CM | POA: Insufficient documentation

## 2023-06-04 DIAGNOSIS — M65912 Unspecified synovitis and tenosynovitis, left shoulder: Secondary | ICD-10-CM | POA: Diagnosis not present

## 2023-06-04 DIAGNOSIS — Z01812 Encounter for preprocedural laboratory examination: Secondary | ICD-10-CM

## 2023-06-04 DIAGNOSIS — M19012 Primary osteoarthritis, left shoulder: Secondary | ICD-10-CM | POA: Insufficient documentation

## 2023-06-04 DIAGNOSIS — M7502 Adhesive capsulitis of left shoulder: Secondary | ICD-10-CM | POA: Insufficient documentation

## 2023-06-04 DIAGNOSIS — M7522 Bicipital tendinitis, left shoulder: Secondary | ICD-10-CM | POA: Insufficient documentation

## 2023-06-04 DIAGNOSIS — J45909 Unspecified asthma, uncomplicated: Secondary | ICD-10-CM | POA: Diagnosis not present

## 2023-06-04 DIAGNOSIS — E0590 Thyrotoxicosis, unspecified without thyrotoxic crisis or storm: Secondary | ICD-10-CM

## 2023-06-04 DIAGNOSIS — M75112 Incomplete rotator cuff tear or rupture of left shoulder, not specified as traumatic: Secondary | ICD-10-CM | POA: Diagnosis not present

## 2023-06-04 DIAGNOSIS — K219 Gastro-esophageal reflux disease without esophagitis: Secondary | ICD-10-CM | POA: Diagnosis not present

## 2023-06-04 DIAGNOSIS — M545 Low back pain, unspecified: Secondary | ICD-10-CM | POA: Insufficient documentation

## 2023-06-04 DIAGNOSIS — E119 Type 2 diabetes mellitus without complications: Secondary | ICD-10-CM | POA: Insufficient documentation

## 2023-06-04 DIAGNOSIS — G894 Chronic pain syndrome: Secondary | ICD-10-CM | POA: Insufficient documentation

## 2023-06-04 DIAGNOSIS — Q0700 Arnold-Chiari syndrome without spina bifida or hydrocephalus: Secondary | ICD-10-CM | POA: Diagnosis not present

## 2023-06-04 DIAGNOSIS — Z01810 Encounter for preprocedural cardiovascular examination: Secondary | ICD-10-CM

## 2023-06-04 DIAGNOSIS — M7582 Other shoulder lesions, left shoulder: Secondary | ICD-10-CM | POA: Diagnosis not present

## 2023-06-04 DIAGNOSIS — G8918 Other acute postprocedural pain: Secondary | ICD-10-CM | POA: Diagnosis not present

## 2023-06-04 HISTORY — DX: Thyrotoxicosis, unspecified without thyrotoxic crisis or storm: E05.90

## 2023-06-04 HISTORY — PX: SHOULDER ARTHROSCOPY WITH SUBACROMIAL DECOMPRESSION, ROTATOR CUFF REPAIR AND BICEP TENDON REPAIR: SHX5687

## 2023-06-04 SURGERY — SHOULDER ARTHROSCOPY WITH SUBACROMIAL DECOMPRESSION, ROTATOR CUFF REPAIR AND BICEP TENDON REPAIR
Anesthesia: Regional | Site: Shoulder | Laterality: Left

## 2023-06-04 MED ORDER — FENTANYL CITRATE PF 50 MCG/ML IJ SOSY
50.0000 ug | PREFILLED_SYRINGE | Freq: Once | INTRAMUSCULAR | Status: AC
  Administered 2023-06-04: 50 ug via INTRAVENOUS

## 2023-06-04 MED ORDER — DEXAMETHASONE SODIUM PHOSPHATE 10 MG/ML IJ SOLN
INTRAMUSCULAR | Status: DC | PRN
  Administered 2023-06-04: 5 mg via INTRAVENOUS

## 2023-06-04 MED ORDER — DEXAMETHASONE SODIUM PHOSPHATE 10 MG/ML IJ SOLN
INTRAMUSCULAR | Status: AC
  Filled 2023-06-04: qty 1

## 2023-06-04 MED ORDER — BUPIVACAINE HCL (PF) 0.5 % IJ SOLN
INTRAMUSCULAR | Status: AC
  Filled 2023-06-04: qty 10

## 2023-06-04 MED ORDER — BUPIVACAINE-EPINEPHRINE 0.5% -1:200000 IJ SOLN
INTRAMUSCULAR | Status: DC | PRN
  Administered 2023-06-04: 30 mL

## 2023-06-04 MED ORDER — LIDOCAINE HCL (CARDIAC) PF 100 MG/5ML IV SOSY
PREFILLED_SYRINGE | INTRAVENOUS | Status: DC | PRN
  Administered 2023-06-04: 70 mg via INTRAVENOUS

## 2023-06-04 MED ORDER — PHENYLEPHRINE HCL-NACL 20-0.9 MG/250ML-% IV SOLN
INTRAVENOUS | Status: AC
  Filled 2023-06-04: qty 250

## 2023-06-04 MED ORDER — ORAL CARE MOUTH RINSE: 15.0000 mL | Freq: Once | OROMUCOSAL | Status: AC

## 2023-06-04 MED ORDER — BUPIVACAINE LIPOSOME 1.3 % IJ SUSP
INTRAMUSCULAR | Status: AC
  Filled 2023-06-04: qty 20

## 2023-06-04 MED ORDER — MIDAZOLAM HCL 2 MG/2ML IJ SOLN
INTRAMUSCULAR | Status: DC | PRN
Start: 2023-06-04 — End: 2023-06-04
  Administered 2023-06-04: 1 mg via INTRAVENOUS

## 2023-06-04 MED ORDER — LIDOCAINE HCL (PF) 2 % IJ SOLN
INTRAMUSCULAR | Status: AC
  Filled 2023-06-04: qty 5

## 2023-06-04 MED ORDER — CEFAZOLIN SODIUM-DEXTROSE 2-4 GM/100ML-% IV SOLN
2.0000 g | INTRAVENOUS | Status: AC
  Administered 2023-06-04: 2 g via INTRAVENOUS

## 2023-06-04 MED ORDER — BUPIVACAINE LIPOSOME 1.3 % IJ SUSP
INTRAMUSCULAR | Status: DC | PRN
  Administered 2023-06-04: 20 mL via PERINEURAL

## 2023-06-04 MED ORDER — ROCURONIUM BROMIDE 10 MG/ML (PF) SYRINGE
PREFILLED_SYRINGE | INTRAVENOUS | Status: AC
  Filled 2023-06-04: qty 10

## 2023-06-04 MED ORDER — CHLORHEXIDINE GLUCONATE 0.12 % MT SOLN
OROMUCOSAL | Status: AC
  Filled 2023-06-04: qty 15

## 2023-06-04 MED ORDER — PROPOFOL 10 MG/ML IV BOLUS
INTRAVENOUS | Status: AC
  Filled 2023-06-04: qty 20

## 2023-06-04 MED ORDER — ONDANSETRON HCL 4 MG/2ML IJ SOLN
INTRAMUSCULAR | Status: DC | PRN
  Administered 2023-06-04: 4 mg via INTRAVENOUS

## 2023-06-04 MED ORDER — LACTATED RINGERS IV SOLN: INTRAVENOUS | Status: DC | PRN

## 2023-06-04 MED ORDER — SUGAMMADEX SODIUM 200 MG/2ML IV SOLN
INTRAVENOUS | Status: AC
  Filled 2023-06-04: qty 2

## 2023-06-04 MED ORDER — BUPIVACAINE HCL (PF) 0.5 % IJ SOLN
INTRAMUSCULAR | Status: DC | PRN
Start: 2023-06-04 — End: 2023-06-04
  Administered 2023-06-04: 10 mL via PERINEURAL

## 2023-06-04 MED ORDER — PHENYLEPHRINE HCL-NACL 20-0.9 MG/250ML-% IV SOLN
INTRAVENOUS | Status: DC | PRN
  Administered 2023-06-04: 20 ug/min via INTRAVENOUS

## 2023-06-04 MED ORDER — ROCURONIUM BROMIDE 100 MG/10ML IV SOLN
INTRAVENOUS | Status: DC | PRN
  Administered 2023-06-04: 50 mg via INTRAVENOUS
  Administered 2023-06-04: 10 mg via INTRAVENOUS

## 2023-06-04 MED ORDER — MIDAZOLAM HCL 2 MG/2ML IJ SOLN
1.0000 mg | INTRAMUSCULAR | Status: AC | PRN
  Administered 2023-06-04 (×2): 1 mg via INTRAVENOUS

## 2023-06-04 MED ORDER — FENTANYL CITRATE (PF) 100 MCG/2ML IJ SOLN
INTRAMUSCULAR | Status: AC
  Filled 2023-06-04: qty 2

## 2023-06-04 MED ORDER — CEFAZOLIN SODIUM-DEXTROSE 2-4 GM/100ML-% IV SOLN
INTRAVENOUS | Status: AC
  Filled 2023-06-04: qty 100

## 2023-06-04 MED ORDER — FENTANYL CITRATE PF 50 MCG/ML IJ SOSY
PREFILLED_SYRINGE | INTRAMUSCULAR | Status: AC
  Filled 2023-06-04: qty 1

## 2023-06-04 MED ORDER — OXYCODONE HCL 5 MG PO TABS: 5.0000 mg | ORAL_TABLET | ORAL | 0 refills | Status: AC | PRN

## 2023-06-04 MED ORDER — MIDAZOLAM HCL 2 MG/2ML IJ SOLN
INTRAMUSCULAR | Status: AC
  Filled 2023-06-04: qty 2

## 2023-06-04 MED ORDER — PROPOFOL 10 MG/ML IV BOLUS
INTRAVENOUS | Status: DC | PRN
  Administered 2023-06-04: 200 mg via INTRAVENOUS

## 2023-06-04 MED ORDER — LACTATED RINGERS IV SOLN: INTRAVENOUS | Status: DC

## 2023-06-04 MED ORDER — ONDANSETRON HCL 4 MG/2ML IJ SOLN
INTRAMUSCULAR | Status: AC
  Filled 2023-06-04: qty 2

## 2023-06-04 MED ORDER — BUPIVACAINE-EPINEPHRINE (PF) 0.5% -1:200000 IJ SOLN
INTRAMUSCULAR | Status: AC
  Filled 2023-06-04: qty 10

## 2023-06-04 MED ORDER — CHLORHEXIDINE GLUCONATE 0.12 % MT SOLN
15.0000 mL | Freq: Once | OROMUCOSAL | Status: AC
  Administered 2023-06-04: 15 mL via OROMUCOSAL

## 2023-06-04 MED ORDER — PHENYLEPHRINE 80 MCG/ML (10ML) SYRINGE FOR IV PUSH (FOR BLOOD PRESSURE SUPPORT)
PREFILLED_SYRINGE | INTRAVENOUS | Status: AC
  Filled 2023-06-04: qty 10

## 2023-06-04 MED ORDER — FENTANYL CITRATE (PF) 100 MCG/2ML IJ SOLN
INTRAMUSCULAR | Status: DC | PRN
  Administered 2023-06-04 (×2): 50 ug via INTRAVENOUS

## 2023-06-04 MED ORDER — SUGAMMADEX SODIUM 200 MG/2ML IV SOLN
INTRAVENOUS | Status: DC | PRN
Start: 2023-06-04 — End: 2023-06-04
  Administered 2023-06-04: 200 mg via INTRAVENOUS

## 2023-06-04 MED ORDER — PHENYLEPHRINE 80 MCG/ML (10ML) SYRINGE FOR IV PUSH (FOR BLOOD PRESSURE SUPPORT)
PREFILLED_SYRINGE | INTRAVENOUS | Status: DC | PRN
  Administered 2023-06-04: 160 ug via INTRAVENOUS
  Administered 2023-06-04: 80 ug via INTRAVENOUS
  Administered 2023-06-04 (×2): 160 ug via INTRAVENOUS

## 2023-06-04 MED ORDER — SODIUM CHLORIDE 0.9 % IR SOLN
Status: DC | PRN
  Administered 2023-06-04: 1

## 2023-06-04 SURGICAL SUPPLY — 46 items
ANCHOR BONE REGENETEN (Anchor) IMPLANT
ANCHOR JUGGERKNOT WTAP NDL 2.9 (Anchor) IMPLANT
ANCHOR TENDON REGENETEN (Staple) IMPLANT
BIT DRILL JUGRKNT W/NDL BIT2.9 (DRILL) IMPLANT
BLADE FULL RADIUS 3.5 (BLADE) ×1 IMPLANT
BUR ACROMIONIZER 4.0 (BURR) ×1 IMPLANT
CHLORAPREP W/TINT 26 (MISCELLANEOUS) ×1 IMPLANT
COVER MAYO STAND STRL (DRAPES) ×1 IMPLANT
DILATOR 5.5 THREADED HEALICOIL (MISCELLANEOUS) IMPLANT
DRILL JUGGERKNOT W/NDL BIT 2.9 (DRILL) ×1 IMPLANT
ELECT CAUTERY BLADE 6.4 (BLADE) ×1 IMPLANT
ELECT REM PT RETURN 9FT ADLT (ELECTROSURGICAL) ×1 IMPLANT
ELECTRODE REM PT RTRN 9FT ADLT (ELECTROSURGICAL) ×1 IMPLANT
GAUZE SPONGE 4X4 12PLY STRL (GAUZE/BANDAGES/DRESSINGS) ×1 IMPLANT
GAUZE XEROFORM 1X8 LF (GAUZE/BANDAGES/DRESSINGS) ×1 IMPLANT
GLOVE BIO SURGEON STRL SZ7.5 (GLOVE) ×2 IMPLANT
GLOVE BIO SURGEON STRL SZ8 (GLOVE) ×2 IMPLANT
GLOVE BIOGEL PI IND STRL 8 (GLOVE) ×1 IMPLANT
GLOVE INDICATOR 8.0 STRL GRN (GLOVE) ×1 IMPLANT
GOWN STRL REUS W/ TWL LRG LVL3 (GOWN DISPOSABLE) ×1 IMPLANT
GOWN STRL REUS W/ TWL XL LVL3 (GOWN DISPOSABLE) ×1 IMPLANT
GRASPER SUT 15 45D LOW PRO (SUTURE) IMPLANT
IMPL REGENETEN LRG (Shoulder) IMPLANT
IMPLANT REGENETEN LRG (Shoulder) ×1 IMPLANT
IV LR IRRIG 3000ML ARTHROMATIC (IV SOLUTION) ×2 IMPLANT
KIT CANNULA 8X76-LX IN CANNULA (CANNULA) ×1 IMPLANT
MANIFOLD NEPTUNE II (INSTRUMENTS) ×1 IMPLANT
MASK FACE SPIDER DISP (MASK) ×1 IMPLANT
MAT ABSORB FLUID 56X50 GRAY (MISCELLANEOUS) ×1 IMPLANT
PACK ARTHROSCOPY SHOULDER (MISCELLANEOUS) ×1 IMPLANT
PAD ABD DERMACEA PRESS 5X9 (GAUZE/BANDAGES/DRESSINGS) ×2 IMPLANT
PASSER SUT FIRSTPASS SELF (INSTRUMENTS) IMPLANT
SLING ARM LRG DEEP (SOFTGOODS) ×1 IMPLANT
SLING ULTRA II LG (MISCELLANEOUS) ×1 IMPLANT
SPONGE T-LAP 18X18 ~~LOC~~+RFID (SPONGE) ×1 IMPLANT
STAPLER SKIN PROX 35W (STAPLE) ×1 IMPLANT
STRAP SAFETY 5IN WIDE (MISCELLANEOUS) ×1 IMPLANT
SUT ETHIBOND 0 MO6 C/R (SUTURE) ×1 IMPLANT
SUT ULTRABRAID 2 COBRAID 38 (SUTURE) IMPLANT
SUT VIC AB 2-0 CT1 TAPERPNT 27 (SUTURE) ×2 IMPLANT
TAPE MICROFOAM 4IN (TAPE) ×1 IMPLANT
TRAP FLUID SMOKE EVACUATOR (MISCELLANEOUS) ×1 IMPLANT
TUBE SET DOUBLEFLO INFLOW (TUBING) ×1 IMPLANT
TUBING CONNECTING 10 (TUBING) ×1 IMPLANT
WAND WEREWOLF FLOW 90D (MISCELLANEOUS) ×1 IMPLANT
WATER STERILE IRR 500ML POUR (IV SOLUTION) ×1 IMPLANT

## 2023-06-04 NOTE — Anesthesia Preprocedure Evaluation (Addendum)
 Anesthesia Evaluation  Patient identified by MRN, date of birth, ID band Patient awake    Reviewed: Allergy & Precautions, H&P , NPO status , Patient's Chart, lab work & pertinent test results  History of Anesthesia Complications (+) PONV and history of anesthetic complications  Airway Mallampati: III  TM Distance: >3 FB Neck ROM: full    Dental  (+) Poor Dentition, Missing, Chipped   Pulmonary asthma    Pulmonary exam normal        Cardiovascular Exercise Tolerance: Good hypertension, Normal cardiovascular exam+ Valvular Problems/Murmurs      Neuro/Psych H/o Arnold-Chiari syndrome s/p  suboccipital craniectomy and C1 laminectomy for Chiari malformation 2022  Chronic pain syndrome Chronic radicular lumbar pain    negative psych ROS   GI/Hepatic Neg liver ROS,GERD  ,,  Endo/Other  negative endocrine ROS    Renal/GU      Musculoskeletal  (+) Arthritis ,    Abdominal  (+) + obese  Peds  Hematology negative hematology ROS (+)   Anesthesia Other Findings Past Medical History: No date: Anemia 12/11/2020: Arnold-Chiari syndrome (HCC) No date: Arthritis No date: Asthma 2009: Complication of anesthesia     Comment:  woke up during procedure No date: GERD (gastroesophageal reflux disease) No date: Heart murmur 09/13/2016: Hematuria, microscopic No date: History of kidney stones No date: Hypertension No date: Hyperthyroidism 01/17/2016: Lumbar degenerative disc disease 01/17/2016: Lumbar radiculopathy 09/24/2018: Lumbar spondylosis No date: PONV (postoperative nausea and vomiting) No date: Pre-diabetes No date: Ulcer of gastric fundus  Past Surgical History: 1991: ABDOMINAL HYSTERECTOMY 2006: BREAST BIOPSY; Right     Comment:  benign 1999: CARPAL TUNNEL RELEASE; Right 1998: CHOLECYSTECTOMY 2009: COLONOSCOPY 12/11/2020: CRANIECTOMY; N/A     Comment:  Procedure: SUBOCCIPITAL CRANIECTOMY, C1 LAMINECTOMY,                DURAPLASTY;  Surgeon: Lucy Chris, MD;  Location: [facility]               ORS;  Service: Neurosurgery;  Laterality: N/A;     Reproductive/Obstetrics negative OB ROS                             Anesthesia Physical Anesthesia Plan  ASA: 2  Anesthesia Plan: General ETT   Post-op Pain Management: Regional block*   Induction: Intravenous  PONV Risk Score and Plan: 2 and Ondansetron, Dexamethasone and Midazolam  Airway Management Planned: Oral ETT  Additional Equipment:   Intra-op Plan:   Post-operative Plan: Extubation in OR  Informed Consent: I have reviewed the patients History and Physical, chart, labs and discussed the procedure including the risks, benefits and alternatives for the proposed anesthesia with the patient or authorized representative who has indicated his/her understanding and acceptance.     Dental Advisory Given  Plan Discussed with: CRNA and Surgeon  Anesthesia Plan Comments:         Anesthesia Quick Evaluation

## 2023-06-04 NOTE — Anesthesia Procedure Notes (Signed)
 Anesthesia Regional Block: Interscalene brachial plexus block   Pre-Anesthetic Checklist: , timeout performed,  Correct Patient, Correct Site, Correct Laterality,  Correct Procedure, Correct Position, site marked,  Risks and benefits discussed,  Surgical consent,  Pre-op evaluation,  At surgeon's request and post-op pain management  Laterality: Upper and Left  Prep: chloraprep       Needles:  Injection technique: Single-shot  Needle Type: Stimiplex     Needle Length: 9cm  Needle Gauge: 22     Additional Needles:   Procedures:,,,, ultrasound used (permanent image in chart),,    Narrative:  Start time: 06/04/2023 10:25 AM End time: 06/04/2023 10:30 AM Injection made incrementally with aspirations every 5 mL.  Performed by: Personally  Anesthesiologist: Foye Deer, MD  Additional Notes: Patient consented for risk and benefits of nerve block including but not limited to nerve damage, failed block, bleeding and infection.  Patient voiced understanding.  Functioning IV was confirmed and monitors were applied.  Timeout done prior to procedure and prior to any sedation being given to the patient.  Patient confirmed procedure site prior to any sedation given to the patient. Sterile prep,hand hygiene and sterile gloves were used.  Minimal sedation used for procedure.  No paresthesia endorsed by patient during the procedure.  Negative aspiration and negative test dose prior to incremental administration of local anesthetic. The patient tolerated the procedure well with no immediate complications.

## 2023-06-04 NOTE — Op Note (Signed)
 06/04/2023  1:28 PM  Patient:   Abigail Huffman  Pre-Op Diagnosis:   Impingement/tendinopathy with partial-thickness rotator cuff tear and biceps tendinopathy, left shoulder.  Post-Op Diagnosis:   Impingement/tendinopathy with partial-thickness rotator cuff tear, adhesive capsulitis, degenerative labral fraying, and biceps tendinopathy, left shoulder.  Procedure:   Manipulation under anesthesia, extensive arthroscopic debridement, arthroscopic subacromial decompression, mini-open rotator cuff repair using a Regeneten patch, and mini-open biceps tenodesis, left shoulder.  Anesthesia:   General endotracheal with interscalene block placed preoperatively by the anesthesiologist.  Surgeon:   Maryagnes Amos, MD  Assistant:   Conley Simmonds, RNFA  Findings:   As above.  There were 2 parallel bursal sided tears involving the supraspinatus tendon involving approximately 50% of the thickness of the tendon.  The remainder of the rotator cuff was in satisfactory condition.  There was moderate fraying of the superior and posterior superior portions of the labrum without frank detachment from the glenoid rim.  The biceps tendon demonstrated moderate "lip sticking" without partial full-thickness tearing.  There was significant synovitis in the joint as well.  Prior to manipulation, the left shoulder could be forward flexed to 130 and abducted to 120. At 90 of abduction, the shoulder could be externally rotated to 70 and internally rotated to 50. Following manipulation, the shoulder could be forward flexed to 165, abducted to 155 and, at 90 of abduction, externally rotated to 90 and internally rotated to 65.  Complications:   None  Estimated blood loss:   10 cc  Fluids:   500 cc  Tourniquet time:   None  Drains:   None  Closure:   Staples      Brief clinical note:   The patient is a 63 year old female with a history of progressively worsening left shoulder pain and weakness.  The patient's  symptoms have progressed despite medications, activity modification, etc. The patient's history and examination are consistent with impingement/tendinopathy with a rotator cuff tear. These findings were confirmed by MRI scan. The patient presents at this time for definitive management of these shoulder symptoms.  Procedure:   The patient underwent placement of an interscalene block by the anesthesiologist in the preoperative holding area before being brought into the operating room and lain in the supine position. The patient then underwent general endotracheal intubation and anesthesia before being repositioned in the beach chair position using the beach chair positioner. The left shoulder and upper extremity were prepped with ChloraPrep solution before being draped sterilely. Preoperative antibiotics were administered. A timeout was performed to confirm the proper surgical site before the expected portal sites and incision site were injected with 0.5% Sensorcaine with epinephrine.   Prior to beginning the case, the left shoulder was gently manipulated in both abduction and external rotation, as well as adduction and internal rotation. Several palpable and audible pops were heard as the scar tissue released, permitting full range of motion of the shoulder.   A posterior portal was created and the glenohumeral joint thoroughly inspected with the findings as described above. An anterior portal was created using an outside-in technique. The labrum and rotator cuff were further probed, again confirming the above-noted findings. The areas of labral fraying were debrided back to stable margins using the full-radius resector, as were areas of synovitis. The ArthroCare wand was inserted and used to release the biceps tendon from its labral anchor. It also was used to obtain hemostasis as well as to "anneal" the labrum superiorly and anteriorly. In addition, the ArthroCare wand was  used to debride the rotator interval  to reduce the likelihood of recurrent adhesive capsulitis postoperatively. The instruments were removed from the joint after suctioning the excess fluid.  The camera was repositioned through the posterior portal into the subacromial space. A separate lateral portal was created using an outside-in technique. The 3.5 mm full-radius resector was introduced and used to perform a subtotal bursectomy. The ArthroCare wand was then inserted and used to remove the periosteal tissue off the undersurface of the anterior third of the acromion as well as to recess the coracoacromial ligament from its attachment along the anterior and lateral margins of the acromion. The 4.0 mm acromionizing bur was introduced and used to complete the decompression by removing the undersurface of the anterior third of the acromion. The full radius resector was reintroduced to remove any residual bony debris before the ArthroCare wand was reintroduced to obtain hemostasis. The instruments were then removed from the subacromial space after suctioning the excess fluid.  An approximately 4-5 cm incision was made over the anterolateral aspect of the shoulder beginning at the anterolateral corner of the acromion and extending distally in line with the bicipital groove. This incision was carried down through the subcutaneous tissues to expose the deltoid fascia. The raphae between the anterior and middle thirds was identified and this plane developed to provide access into the subacromial space. Additional bursal tissues were debrided sharply using Metzenbaum scissors. The rotator cuff tear was readily identified.   The bicipital groove was identified by palpation and opened for 1-1.5 cm. The biceps tendon stump was retrieved through this defect. The floor of the bicipital groove was roughened with a curet before a Biomet 2.9 mm JuggerKnot anchor was inserted. Both sets of sutures were passed through the biceps tendon and tied securely to effect  the tenodesis. The bicipital sheath was reapproximated using two #0 Ethibond interrupted sutures, incorporating the biceps tendon to further reinforce the tenodesis.  The frayed portions of the bursal sided partial-thickness rotator cuff tear were debrided back to stable margins using the 15 blade. Each of the 2 longitudinal tears were reapproximated using #0 Ethibond interrupted sutures placed in a side-to-side fashion. Given the poor quality of supraspinatus tendon tissue, it was felt best to reinforce this repair using a [employer] regenerative patch. Therefore, a large patch was selected and applied over the repair site. This patch was then secured using appropriate soft tissue staples. An apparent watertight closure was obtained.  The wound was copiously irrigated with sterile saline solution before the deltoid raphae was reapproximated using 2-0 Vicryl interrupted sutures. The subcutaneous tissues were closed in two layers using 2-0 Vicryl interrupted sutures before the skin was closed using staples. The portal sites also were closed using staples. A sterile bulky dressing was applied to the shoulder before the arm was placed into a shoulder immobilizer. The patient was then awakened, extubated, and returned to the recovery room in satisfactory condition after tolerating the procedure well.

## 2023-06-04 NOTE — H&P (Signed)
 History of Present Illness: Abigail Huffman is a 63 y.o. female who presents today for history and physical for an upcoming left shoulder arthroscopy with debridement, decompression, distal clavicle excision, biceps tenodesis and rotator cuff repair to be done on June 04, 2023 by Dr. Joice Lofts. The patient was evaluated by Micah Noel in July of last year, at this visit the patient was experiencing ongoing left shoulder pain which had developed without any trauma or injury. The patient does care for her husband who does have dementia. The patient is right-hand dominant. She does have a history of cervical spine surgery in addition to right carpal tunnel release but denies any surgical history to the left upper extremity. The patient was prescribed Celebrex last year, because of continued pain and limitation of motion a MRI scan was ordered but was initially denied. The patient was reevaluated by her primary care physician who did order a MRI scan which was eventually approved. She presents today to discuss results. The patient continues to have increased discomfort especially trying to reach above her head or out to the side. Pain is located both along the superior, lateral and anterior aspect of the left shoulder. She does have increased pain when attempting sleep on left side night. She has not suffered any repeat trauma or injury affecting left upper extremity. She has been taking Celebrex in addition to using heat with mild relief. The patient denies any personal history of heart attack or stroke. The patient is a diabetic. She does have a history of asthma. No history of DVT.  Past Medical History: Allergy Aspirin  Asthma without status asthmaticus  Bartholin cyst  Carpal tunnel syndrome  Chronic non-seasonal allergic rhinitis  Chronic pain  Diabetes mellitus type 2, uncomplicated (CMS/HHS-HCC)  Prediabetes  GERD (gastroesophageal reflux disease)  Heart murmur 1990  Hypertension  Multilevel degenerative  disc disease  Multiple sclerosis (CMS/HHS-HCC)  Multiple thyroid nodules  Nodules stable on ultrasounds 2017 - 2020. Biopsies in 2017 of two dominant nodules were benign. No follow-up needed (Dr Tedd Sias).  Stomach ulcer   Past Surgical History: COLONOSCOPY N/A 03/13/2017 (Dr. Leavy Cella @ [facility])  SUBOCCIPITAL CRANIECTOMY, C1 LAMINECTOMY, DURAPLASTY 12/11/2020 (Dr. Lucy Chris at [facility])  CHOLECYSTECTOMY  HYSTERECTOMY   Past Family History: High blood pressure (Hypertension) Mother  High blood pressure (Hypertension) Father  Colon cancer Father  Lung cancer Father  High blood pressure (Hypertension) Maternal Grandmother   Medications: albuterol MDI, PROVENTIL, VENTOLIN, PROAIR, HFA 90 mcg/actuation inhaler Inhale 2 inhalations into the lungs every 6 (six) hours as needed for Wheezing or Shortness of Breath 6.7 g 3  ARIPiprazole (ABILIFY) 2 MG tablet Take 2 tablets (4 mg total) by mouth once daily 180 tablet 3  beclomethasone (QVAR) 40 mcg/actuation inhaler Inhale 1 inhalation into the lungs 2 (two) times daily 1 g 11  busPIRone (BUSPAR) 5 MG tablet Take 1 tablet (5 mg total) by mouth 2 (two) times daily 60 tablet 5  celecoxib (CELEBREX) 200 MG capsule Take 1 capsule by mouth twice daily 60 capsule 0  cetirizine (ZYRTEC) 10 MG chewable tablet Take 1 tablet (10 mg total) by mouth once daily 90 tablet 3  cyclobenzaprine (FLEXERIL) 5 MG tablet Take 1 tablet (5 mg total) by mouth 3 (three) times daily as needed for Muscle spasms 90 tablet 0  estradiol (DOTTI) patch 0.05 mg/24 hr Place 1 patch onto the skin twice a week 8 patch 11  hydroCHLOROthiazide (HYDRODIURIL) 25 MG tablet Take 1 tablet (25 mg total) by  mouth once daily 90 tablet 3  krill/om-3/dha/epa/phospho/ast (MEGARED OMEGA-3 KRILL OIL ORAL) Take 1 capsule by mouth once daily  methIMAzole (TAPAZOLE) 5 MG tablet Take one tablet (5 mg) every other day. 15 tablet 3  metoprolol tartrate (LOPRESSOR) 25 MG tablet Take 1 tablet by mouth  once daily 90 tablet 0  MULTIVITAMIN ORAL Take 1 tablet by mouth once daily  oxyBUTYnin (DITROPAN-XL) 5 MG XL tablet Take 1 tablet (5 mg total) by mouth once daily 30 tablet 11  pantoprazole (PROTONIX) 40 MG DR tablet Take 1 tablet (40 mg total) by mouth 2 (two) times daily before meals 60 tablet 11  pyridoxine, vitamin B6, (VITAMIN B-6) 50 MG tablet Take by mouth  sennosides (SENOKOT) 8.6 mg tablet Take by mouth  traZODone (DESYREL) 100 MG tablet Take 1 tablet (100 mg total) by mouth at bedtime 30 tablet 11  albuterol (PROVENTIL) 2.5 mg /3 mL (0.083 %) nebulizer solution Take 3 mLs (2.5 mg total) by nebulization every 6 (six) hours as needed for Wheezing 75 mL 12  estradioL (CLIMARA) 0.05 mg/24 hr patch Place 1 patch onto the skin once a week (Patient not taking: Reported on 05/28/2023) 4 patch 11  FLUoxetine (PROZAC) 20 MG capsule Take 1 capsule (20 mg total) by mouth as directed (daily, take wtih the 40 mg for total 60 mg) 90 capsule 3  FLUoxetine (PROZAC) 40 MG capsule Take 1 capsule (40 mg total) by mouth once daily 90 capsule 3   Allergies: Gabapentin Headache  Aspirin Hives  Lyrica [Pregabalin] Other (Hematuria)  Review of Systems:  A comprehensive 14 point ROS was performed, reviewed by me today, and the pertinent orthopaedic findings are documented in the HPI.  Physical Exam: BP 132/86  Ht 157.5 cm (5\' 2" )  Wt 87.1 kg (192 lb)  LMP (LMP Unknown)  BMI 35.12 kg/m  General/Constitutional: The patient appears to be well-nourished, well-developed, and in no acute distress. Neuro/Psych: Normal mood and affect, oriented to person, place and time. Eyes: Non-icteric. Pupils are equal, round, and reactive to light, and exhibit synchronous movement. ENT: Unremarkable. Lymphatic: No palpable adenopathy. Respiratory: Non-labored breathing Cardiovascular: No edema, swelling or tenderness, except as noted in detailed exam. Integumentary: No impressive skin lesions present, except as noted  in detailed exam. Musculoskeletal: Unremarkable, except as noted in detailed exam.  Heart: Examination of the heart reveals regular, rate, and rhythm. There is no murmur noted on ascultation. There is a normal apical pulse.  Lungs: Lungs are clear to auscultation. There is no wheeze, rhonchi, or crackles. There is normal expansion of bilateral chest walls.   General: Well developed, well nourished 63 y.o. female in no apparent distress. Normal affect. Normal communication. Patient answers questions appropriately. The patient has a normal gait. There is no antalgic component. There is no hip lurch.   Cranial Nerves: Pupils equal round and reactive to light. Facial tone is symmetric. Facial sensation is symmetric. Shoulder shrug is symmetric. Tongue protrusion is midline. There is no pronator drift.  Skin examination of the posterior aspect of the cervical spine reveals a well-healed previous surgical incision without any evidence for infection.  ROM of spine: The patient does have full intact cervical spine flexion and extension at today's visit. The patient does have pain at the extremes of extension. The patient does have decreased left and right bend with rotation at today's appointment, this does create moderate aching discomfort along the posterior aspect of the left shoulder blade.  Skin examination of the left shoulder demonstrates  no open wound, erythema or ecchymosis. There is no previous surgical incision. The patient is able to forward flex close to 85 degrees with moderate pain. Abduction close to 80 degrees moderate pain. She does have full passive range of motion to the left shoulder. With the left arm abducted 90 degrees she can tolerate external rotation close to 75 degrees, internal rotation 60 degrees. The patient does have moderate pain with palpation of the left shoulder including over the subacromial space and AC joint and along the proximal bicep tendon. The patient has a  positive speeds test, positive impingement test, positive Hawkins test. The patient does have a positive drop arm test to the left upper extremity at today's visit.   Strength: Side Biceps Triceps Deltoid Interossei Grip Wrist Ext. Wrist Flex.  R 5 5 5 5 5 5 5   L 4 5 4 5 5 5 5    Reflexes are 1+ and symmetric at the biceps, triceps, brachioradialis. Bilateral upper extremity sensation is intact to light touch. Clonus is not present. Toes are down-going. Gait is normal. No difficulty with tandem gait. Hoffman's is absent.  Rapid alternating movements are normal.   Vascular: The patient has less than 2 second capillary refill. The patient has normal ulnar and radial pulses. The patient has normal warmth to touch.   Imaging: Previous x-rays of the left shoulder were obtained on 09/12/2022. These x-rays are negative for any evidence of acute fracture or dislocation. There is no significant osteoarthritic changes. There does appear to be a subacromial spur noted, subacromial space appears to be well-maintained. The patient does not have any signs of aggressive bony lesion at this time.  MRI OF THE LEFT SHOULDER WITHOUT CONTRAST:  1. Moderate infraspinatus and supraspinatus tendinosis. Very mild  partial-thickness bursal sided tear of the mid to anterior aspect of  the supraspinatus tendon measuring only 2 mm in transverse depth.  2. Very mild midsubstance longitudinal tear of the proximal long  head of the biceps tendon, proximal to the bicipital groove.  3. Moderate degenerative changes of the acromioclavicular joint.  Type III acromion with mild-to-moderate downsloping of the  anterolateral acromion and mild-to-moderate peripheral degenerative  spurring.  4. Moderate subacromial/subdeltoid bursitis.   Both the images and the report were discussed in detail with the patient today's visit.  Impression: 1. Nontraumatic incomplete tear of left rotator cuff. 2. Rotator cuff tendonitis,  left. 3. Biceps tendonitis on left.  Plan:  1. Treatment options were discussed today with the patient. 2. The MRI scan of left shoulder does reveal evidence of a partial-thickness rotator cuff tear, biceps tendinitis in addition to [facility] joint arthritic changes and a downsloping acromion. 3. Both conservative and aggressive options were discussed today with the patient, after a discussion the patient would like to proceed with more aggressive treatment for her left shoulder. 4. The patient was instructed on the risk and benefits of a left shoulder arthroscopy with debridement, decompression, distal clavicle excision, biceps tenodesis and possible rotator cuff repair. After discussion of the risk and benefits the patient would like to proceed with surgery. She will be scheduled with Dr. Joice Lofts in the future. 5. This document will serve as a surgical history and physical for the patient. She was instructed that she can contact the clinic if she has any questions, new symptoms develop or symptoms worsen. 6. The patient will follow-up per standard postop protocol.  The procedure was discussed with the patient, as were the potential risks (including bleeding, infection, nerve  and/or blood vessel injury, persistent or recurrent pain, failure of the repair, progression of arthritis, need for further surgery, blood clots, strokes, heart attacks and/or arhythmias, pneumonia, etc.) and benefits. The patient states her understanding and wishes to proceed.    H&P reviewed and patient re-examined. No changes.

## 2023-06-04 NOTE — Transfer of Care (Signed)
 Immediate Anesthesia Transfer of Care Note  Patient: Abigail Huffman  Procedure(s) Performed: LEFT SHOULDER ARTHROSCOPY WITH DEBRIDEMENT, DECOMPRESSION, DISTAL CLAVICLE EXCISION, BICEPS TENODESIS AND ROTATOR CUFF REPAIR - RNFA (Left: Shoulder)  Patient Location: PACU  Anesthesia Type:General and Regional  Level of Consciousness: drowsy  Airway & Oxygen Therapy: Patient Spontanous Breathing and Patient connected to nasal cannula oxygen  Post-op Assessment: Report given to RN and Post -op Vital signs reviewed and stable  Post vital signs: Reviewed and stable  Last Vitals:  Vitals Value Taken Time  BP 129/81 06/04/23 1330  Temp 36.2 C 06/04/23 1330  Pulse 122 06/04/23 1332  Resp 15 06/04/23 1332  SpO2 99 % 06/04/23 1332  Vitals shown include unfiled device data.  Last Pain:  Vitals:   06/04/23 0942  PainSc: 0-No pain         Complications: No notable events documented.

## 2023-06-04 NOTE — Anesthesia Postprocedure Evaluation (Signed)
 Anesthesia Post Note  Patient: Abigail Huffman  Procedure(s) Performed: LEFT SHOULDER ARTHROSCOPY WITH DEBRIDEMENT, DECOMPRESSION, DISTAL CLAVICLE EXCISION, BICEPS TENODESIS AND ROTATOR CUFF REPAIR - RNFA (Left: Shoulder)  Patient location during evaluation: PACU Anesthesia Type: Regional Level of consciousness: awake and alert Pain management: pain level controlled Vital Signs Assessment: post-procedure vital signs reviewed and stable Respiratory status: spontaneous breathing, nonlabored ventilation and respiratory function stable Cardiovascular status: blood pressure returned to baseline and stable Postop Assessment: no apparent nausea or vomiting Anesthetic complications: no   No notable events documented.   Last Vitals:  Vitals:   06/04/23 1430 06/04/23 1450  BP: (!) 150/92 (!) 155/91  Pulse: 94 85  Resp: 12 18  Temp: 36.6 C (!) 36.1 C  SpO2: 99% 98%    Last Pain:  Vitals:   06/04/23 1450  TempSrc: Temporal  PainSc: 0-No pain                 Foye Deer

## 2023-06-04 NOTE — Discharge Instructions (Addendum)
 Orthopedic discharge instructions: Keep dressing dry and intact.  May shower after dressing changed on post-op day #4 (Sunday).  Cover staples with Band-Aids after drying off. Apply ice frequently to shoulder. Resume Celebrex 200 mg p.o. twice daily with food. Take oxycodone as prescribed when needed.  May supplement with ES Tylenol if necessary. Keep shoulder immobilizer on at all times except may remove for bathing purposes. Follow-up in 10-14 days or as scheduled.  SHOULDER SLING IMMOBILIZER   VIDEO Slingshot 2 Shoulder Brace Application - YouTube ---[email or web address]://www.porter.info/  INSTRUCTIONS While supporting the injured arm, slide the forearm into the sling. Wrap the adjustable shoulder strap around the neck and shoulders and attach the strap end to the sling using  the "alligator strap tab."  Adjust the shoulder strap to the required length. Position the shoulder pad behind the neck. To secure the shoulder pad location (optional), pull the shoulder strap away from the shoulder pad, unfold the hook material on the top of the pad, then press the shoulder strap back onto the hook material to secure the pad in place. Attach the closure strap across the open top of the sling. Position the strap so that it holds the arm securely in the sling. Next, attach the thumb strap to the open end of the sling between the thumb and fingers. After sling has been fit, it may be easily removed and reapplied using the quick release buckle on shoulder strap. If a neutral pillow or 15 abduction pillow is included, place the pillow at the waistline. Attach the sling to the pillow, lining up hook material on the pillow with the loop on sling. Adjust the waist strap to fit.  If waist strap is too long, cut it to fit. Use the small piece of double sided hook material (located on top of the pillow) to secure the strap end. Place the double sided hook material on the inside of the cut strap end and  secure it to the waist strap.     If no pillow is included, attach the waist strap to the sling and adjust to fit.    Washing Instructions: Straps and sling must be removed and cleaned regularly depending on your activity level and perspiration. Hand wash straps and sling in cold water with mild detergent, rinse, air dry

## 2023-06-04 NOTE — Anesthesia Procedure Notes (Signed)
 Procedure Name: Intubation Date/Time: 06/04/2023 11:44 AM  Performed by: Otho Perl, CRNAPre-anesthesia Checklist: Patient identified, Patient being monitored, Timeout performed, Emergency Drugs available and Suction available Patient Re-evaluated:Patient Re-evaluated prior to induction Oxygen Delivery Method: Circle system utilized Preoxygenation: Pre-oxygenation with 100% oxygen Induction Type: IV induction Ventilation: Mask ventilation without difficulty Laryngoscope Size: 3 and McGrath Grade View: Grade I Tube type: Oral Tube size: 6.5 mm Number of attempts: 1 Airway Equipment and Method: Stylet Placement Confirmation: ETT inserted through vocal cords under direct vision, positive ETCO2 and breath sounds checked- equal and bilateral Secured at: 19 cm Tube secured with: Tape Dental Injury: Teeth and Oropharynx as per pre-operative assessment

## 2023-06-05 ENCOUNTER — Encounter: Payer: Self-pay | Admitting: Surgery

## 2023-06-10 DIAGNOSIS — M6281 Muscle weakness (generalized): Secondary | ICD-10-CM | POA: Diagnosis not present

## 2023-06-16 DIAGNOSIS — M6281 Muscle weakness (generalized): Secondary | ICD-10-CM | POA: Diagnosis not present

## 2023-06-23 DIAGNOSIS — G8929 Other chronic pain: Secondary | ICD-10-CM | POA: Diagnosis not present

## 2023-06-23 DIAGNOSIS — M6281 Muscle weakness (generalized): Secondary | ICD-10-CM | POA: Diagnosis not present

## 2023-06-23 DIAGNOSIS — M25612 Stiffness of left shoulder, not elsewhere classified: Secondary | ICD-10-CM | POA: Diagnosis not present

## 2023-06-23 DIAGNOSIS — Z9889 Other specified postprocedural states: Secondary | ICD-10-CM | POA: Diagnosis not present

## 2023-06-23 DIAGNOSIS — M25512 Pain in left shoulder: Secondary | ICD-10-CM | POA: Diagnosis not present

## 2023-07-03 DIAGNOSIS — M6281 Muscle weakness (generalized): Secondary | ICD-10-CM | POA: Diagnosis not present

## 2023-07-11 DIAGNOSIS — M6281 Muscle weakness (generalized): Secondary | ICD-10-CM | POA: Diagnosis not present

## 2023-07-17 DIAGNOSIS — Z9889 Other specified postprocedural states: Secondary | ICD-10-CM | POA: Diagnosis not present

## 2023-07-17 DIAGNOSIS — G8929 Other chronic pain: Secondary | ICD-10-CM | POA: Diagnosis not present

## 2023-07-17 DIAGNOSIS — M25512 Pain in left shoulder: Secondary | ICD-10-CM | POA: Diagnosis not present

## 2023-07-17 DIAGNOSIS — E0590 Thyrotoxicosis, unspecified without thyrotoxic crisis or storm: Secondary | ICD-10-CM | POA: Diagnosis not present

## 2023-07-17 DIAGNOSIS — M25612 Stiffness of left shoulder, not elsewhere classified: Secondary | ICD-10-CM | POA: Diagnosis not present

## 2023-07-17 DIAGNOSIS — M6281 Muscle weakness (generalized): Secondary | ICD-10-CM | POA: Diagnosis not present

## 2023-07-21 DIAGNOSIS — M6281 Muscle weakness (generalized): Secondary | ICD-10-CM | POA: Diagnosis not present

## 2023-07-23 DIAGNOSIS — M6281 Muscle weakness (generalized): Secondary | ICD-10-CM | POA: Diagnosis not present

## 2023-07-28 DIAGNOSIS — M6281 Muscle weakness (generalized): Secondary | ICD-10-CM | POA: Diagnosis not present

## 2023-07-30 DIAGNOSIS — M25612 Stiffness of left shoulder, not elsewhere classified: Secondary | ICD-10-CM | POA: Diagnosis not present

## 2023-07-30 DIAGNOSIS — Z9889 Other specified postprocedural states: Secondary | ICD-10-CM | POA: Diagnosis not present

## 2023-07-30 DIAGNOSIS — G8929 Other chronic pain: Secondary | ICD-10-CM | POA: Diagnosis not present

## 2023-07-30 DIAGNOSIS — M25512 Pain in left shoulder: Secondary | ICD-10-CM | POA: Diagnosis not present

## 2023-07-30 DIAGNOSIS — M6281 Muscle weakness (generalized): Secondary | ICD-10-CM | POA: Diagnosis not present

## 2023-08-04 DIAGNOSIS — M6281 Muscle weakness (generalized): Secondary | ICD-10-CM | POA: Diagnosis not present

## 2023-08-06 DIAGNOSIS — M6281 Muscle weakness (generalized): Secondary | ICD-10-CM | POA: Diagnosis not present

## 2023-08-11 DIAGNOSIS — M6281 Muscle weakness (generalized): Secondary | ICD-10-CM | POA: Diagnosis not present

## 2023-08-13 DIAGNOSIS — Z9889 Other specified postprocedural states: Secondary | ICD-10-CM | POA: Diagnosis not present

## 2023-08-13 DIAGNOSIS — M6281 Muscle weakness (generalized): Secondary | ICD-10-CM | POA: Diagnosis not present

## 2023-08-13 DIAGNOSIS — M25512 Pain in left shoulder: Secondary | ICD-10-CM | POA: Diagnosis not present

## 2023-08-13 DIAGNOSIS — M25612 Stiffness of left shoulder, not elsewhere classified: Secondary | ICD-10-CM | POA: Diagnosis not present

## 2023-08-13 DIAGNOSIS — G8929 Other chronic pain: Secondary | ICD-10-CM | POA: Diagnosis not present

## 2023-08-18 DIAGNOSIS — G8929 Other chronic pain: Secondary | ICD-10-CM | POA: Diagnosis not present

## 2023-08-18 DIAGNOSIS — M6281 Muscle weakness (generalized): Secondary | ICD-10-CM | POA: Diagnosis not present

## 2023-08-18 DIAGNOSIS — M25612 Stiffness of left shoulder, not elsewhere classified: Secondary | ICD-10-CM | POA: Diagnosis not present

## 2023-08-18 DIAGNOSIS — Z9889 Other specified postprocedural states: Secondary | ICD-10-CM | POA: Diagnosis not present

## 2023-08-18 DIAGNOSIS — M25512 Pain in left shoulder: Secondary | ICD-10-CM | POA: Diagnosis not present

## 2023-08-20 DIAGNOSIS — M25612 Stiffness of left shoulder, not elsewhere classified: Secondary | ICD-10-CM | POA: Diagnosis not present

## 2023-08-20 DIAGNOSIS — M25512 Pain in left shoulder: Secondary | ICD-10-CM | POA: Diagnosis not present

## 2023-08-20 DIAGNOSIS — G8929 Other chronic pain: Secondary | ICD-10-CM | POA: Diagnosis not present

## 2023-08-20 DIAGNOSIS — Z9889 Other specified postprocedural states: Secondary | ICD-10-CM | POA: Diagnosis not present

## 2023-08-20 DIAGNOSIS — M6281 Muscle weakness (generalized): Secondary | ICD-10-CM | POA: Diagnosis not present

## 2023-08-27 DIAGNOSIS — M6281 Muscle weakness (generalized): Secondary | ICD-10-CM | POA: Diagnosis not present

## 2023-08-29 DIAGNOSIS — G8929 Other chronic pain: Secondary | ICD-10-CM | POA: Diagnosis not present

## 2023-08-29 DIAGNOSIS — M6281 Muscle weakness (generalized): Secondary | ICD-10-CM | POA: Diagnosis not present

## 2023-08-29 DIAGNOSIS — M25512 Pain in left shoulder: Secondary | ICD-10-CM | POA: Diagnosis not present

## 2023-08-29 DIAGNOSIS — M25612 Stiffness of left shoulder, not elsewhere classified: Secondary | ICD-10-CM | POA: Diagnosis not present

## 2023-08-29 DIAGNOSIS — Z9889 Other specified postprocedural states: Secondary | ICD-10-CM | POA: Diagnosis not present

## 2023-09-05 DIAGNOSIS — M25512 Pain in left shoulder: Secondary | ICD-10-CM | POA: Diagnosis not present

## 2023-09-05 DIAGNOSIS — Z9889 Other specified postprocedural states: Secondary | ICD-10-CM | POA: Diagnosis not present

## 2023-09-05 DIAGNOSIS — M25612 Stiffness of left shoulder, not elsewhere classified: Secondary | ICD-10-CM | POA: Diagnosis not present

## 2023-09-05 DIAGNOSIS — M6281 Muscle weakness (generalized): Secondary | ICD-10-CM | POA: Diagnosis not present

## 2023-09-05 DIAGNOSIS — G8929 Other chronic pain: Secondary | ICD-10-CM | POA: Diagnosis not present

## 2023-09-08 DIAGNOSIS — K5900 Constipation, unspecified: Secondary | ICD-10-CM | POA: Diagnosis not present

## 2023-09-08 DIAGNOSIS — I2510 Atherosclerotic heart disease of native coronary artery without angina pectoris: Secondary | ICD-10-CM | POA: Diagnosis not present

## 2023-09-08 DIAGNOSIS — F324 Major depressive disorder, single episode, in partial remission: Secondary | ICD-10-CM | POA: Diagnosis not present

## 2023-09-08 DIAGNOSIS — Z823 Family history of stroke: Secondary | ICD-10-CM | POA: Diagnosis not present

## 2023-09-08 DIAGNOSIS — I10 Essential (primary) hypertension: Secondary | ICD-10-CM | POA: Diagnosis not present

## 2023-09-08 DIAGNOSIS — K219 Gastro-esophageal reflux disease without esophagitis: Secondary | ICD-10-CM | POA: Diagnosis not present

## 2023-09-08 DIAGNOSIS — M1990 Unspecified osteoarthritis, unspecified site: Secondary | ICD-10-CM | POA: Diagnosis not present

## 2023-09-08 DIAGNOSIS — Z833 Family history of diabetes mellitus: Secondary | ICD-10-CM | POA: Diagnosis not present

## 2023-09-08 DIAGNOSIS — Z809 Family history of malignant neoplasm, unspecified: Secondary | ICD-10-CM | POA: Diagnosis not present

## 2023-09-08 DIAGNOSIS — G4700 Insomnia, unspecified: Secondary | ICD-10-CM | POA: Diagnosis not present

## 2023-09-08 DIAGNOSIS — Z8249 Family history of ischemic heart disease and other diseases of the circulatory system: Secondary | ICD-10-CM | POA: Diagnosis not present

## 2023-09-09 DIAGNOSIS — M6281 Muscle weakness (generalized): Secondary | ICD-10-CM | POA: Diagnosis not present

## 2023-09-09 DIAGNOSIS — M25612 Stiffness of left shoulder, not elsewhere classified: Secondary | ICD-10-CM | POA: Diagnosis not present

## 2023-09-09 DIAGNOSIS — Z9889 Other specified postprocedural states: Secondary | ICD-10-CM | POA: Diagnosis not present

## 2023-09-09 DIAGNOSIS — G8929 Other chronic pain: Secondary | ICD-10-CM | POA: Diagnosis not present

## 2023-09-09 DIAGNOSIS — M25512 Pain in left shoulder: Secondary | ICD-10-CM | POA: Diagnosis not present

## 2023-09-12 DIAGNOSIS — G8929 Other chronic pain: Secondary | ICD-10-CM | POA: Diagnosis not present

## 2023-09-12 DIAGNOSIS — M25612 Stiffness of left shoulder, not elsewhere classified: Secondary | ICD-10-CM | POA: Diagnosis not present

## 2023-09-12 DIAGNOSIS — Z9889 Other specified postprocedural states: Secondary | ICD-10-CM | POA: Diagnosis not present

## 2023-09-12 DIAGNOSIS — M6281 Muscle weakness (generalized): Secondary | ICD-10-CM | POA: Diagnosis not present

## 2023-09-12 DIAGNOSIS — M25512 Pain in left shoulder: Secondary | ICD-10-CM | POA: Diagnosis not present

## 2023-09-16 DIAGNOSIS — Z9889 Other specified postprocedural states: Secondary | ICD-10-CM | POA: Diagnosis not present

## 2023-09-16 DIAGNOSIS — M25612 Stiffness of left shoulder, not elsewhere classified: Secondary | ICD-10-CM | POA: Diagnosis not present

## 2023-09-16 DIAGNOSIS — M25512 Pain in left shoulder: Secondary | ICD-10-CM | POA: Diagnosis not present

## 2023-09-16 DIAGNOSIS — M6281 Muscle weakness (generalized): Secondary | ICD-10-CM | POA: Diagnosis not present

## 2023-09-16 DIAGNOSIS — G8929 Other chronic pain: Secondary | ICD-10-CM | POA: Diagnosis not present

## 2023-09-22 DIAGNOSIS — M25512 Pain in left shoulder: Secondary | ICD-10-CM | POA: Diagnosis not present

## 2023-09-22 DIAGNOSIS — M6281 Muscle weakness (generalized): Secondary | ICD-10-CM | POA: Diagnosis not present

## 2023-09-22 DIAGNOSIS — Z9889 Other specified postprocedural states: Secondary | ICD-10-CM | POA: Diagnosis not present

## 2023-09-22 DIAGNOSIS — M25612 Stiffness of left shoulder, not elsewhere classified: Secondary | ICD-10-CM | POA: Diagnosis not present

## 2023-09-22 DIAGNOSIS — G8929 Other chronic pain: Secondary | ICD-10-CM | POA: Diagnosis not present

## 2023-10-01 DIAGNOSIS — M6281 Muscle weakness (generalized): Secondary | ICD-10-CM | POA: Diagnosis not present

## 2023-10-01 DIAGNOSIS — M25512 Pain in left shoulder: Secondary | ICD-10-CM | POA: Diagnosis not present

## 2023-10-01 DIAGNOSIS — Z9889 Other specified postprocedural states: Secondary | ICD-10-CM | POA: Diagnosis not present

## 2023-10-01 DIAGNOSIS — M25612 Stiffness of left shoulder, not elsewhere classified: Secondary | ICD-10-CM | POA: Diagnosis not present

## 2023-10-01 DIAGNOSIS — G8929 Other chronic pain: Secondary | ICD-10-CM | POA: Diagnosis not present

## 2023-10-08 DIAGNOSIS — M25512 Pain in left shoulder: Secondary | ICD-10-CM | POA: Diagnosis not present

## 2023-10-08 DIAGNOSIS — G8929 Other chronic pain: Secondary | ICD-10-CM | POA: Diagnosis not present

## 2023-10-08 DIAGNOSIS — Z9889 Other specified postprocedural states: Secondary | ICD-10-CM | POA: Diagnosis not present

## 2023-10-08 DIAGNOSIS — M25612 Stiffness of left shoulder, not elsewhere classified: Secondary | ICD-10-CM | POA: Diagnosis not present

## 2023-10-08 DIAGNOSIS — M6281 Muscle weakness (generalized): Secondary | ICD-10-CM | POA: Diagnosis not present

## 2023-10-13 DIAGNOSIS — M7502 Adhesive capsulitis of left shoulder: Secondary | ICD-10-CM | POA: Diagnosis not present

## 2023-10-13 DIAGNOSIS — M7522 Bicipital tendinitis, left shoulder: Secondary | ICD-10-CM | POA: Diagnosis not present

## 2023-10-13 DIAGNOSIS — M7582 Other shoulder lesions, left shoulder: Secondary | ICD-10-CM | POA: Diagnosis not present

## 2023-10-13 DIAGNOSIS — M75112 Incomplete rotator cuff tear or rupture of left shoulder, not specified as traumatic: Secondary | ICD-10-CM | POA: Diagnosis not present

## 2023-10-13 DIAGNOSIS — Z5986 Financial insecurity: Secondary | ICD-10-CM | POA: Diagnosis not present

## 2023-10-15 DIAGNOSIS — E042 Nontoxic multinodular goiter: Secondary | ICD-10-CM | POA: Diagnosis not present

## 2023-10-15 DIAGNOSIS — M25512 Pain in left shoulder: Secondary | ICD-10-CM | POA: Diagnosis not present

## 2023-10-15 DIAGNOSIS — Z9889 Other specified postprocedural states: Secondary | ICD-10-CM | POA: Diagnosis not present

## 2023-10-15 DIAGNOSIS — M6281 Muscle weakness (generalized): Secondary | ICD-10-CM | POA: Diagnosis not present

## 2023-10-15 DIAGNOSIS — M25612 Stiffness of left shoulder, not elsewhere classified: Secondary | ICD-10-CM | POA: Diagnosis not present

## 2023-10-15 DIAGNOSIS — G8929 Other chronic pain: Secondary | ICD-10-CM | POA: Diagnosis not present

## 2023-10-15 DIAGNOSIS — E0590 Thyrotoxicosis, unspecified without thyrotoxic crisis or storm: Secondary | ICD-10-CM | POA: Diagnosis not present

## 2023-10-22 DIAGNOSIS — M6281 Muscle weakness (generalized): Secondary | ICD-10-CM | POA: Diagnosis not present

## 2023-10-29 DIAGNOSIS — Z9889 Other specified postprocedural states: Secondary | ICD-10-CM | POA: Diagnosis not present

## 2023-10-29 DIAGNOSIS — M25512 Pain in left shoulder: Secondary | ICD-10-CM | POA: Diagnosis not present

## 2023-10-29 DIAGNOSIS — M25612 Stiffness of left shoulder, not elsewhere classified: Secondary | ICD-10-CM | POA: Diagnosis not present

## 2023-10-29 DIAGNOSIS — G8929 Other chronic pain: Secondary | ICD-10-CM | POA: Diagnosis not present

## 2023-10-29 DIAGNOSIS — M6281 Muscle weakness (generalized): Secondary | ICD-10-CM | POA: Diagnosis not present

## 2023-11-04 ENCOUNTER — Encounter: Payer: Self-pay | Admitting: Internal Medicine

## 2023-11-11 ENCOUNTER — Ambulatory Visit: Admitting: Anesthesiology

## 2023-11-11 ENCOUNTER — Ambulatory Visit
Admission: RE | Admit: 2023-11-11 | Discharge: 2023-11-11 | Disposition: A | Discharge status: Home / Self Care | Attending: Internal Medicine | Admitting: Internal Medicine

## 2023-11-11 ENCOUNTER — Encounter
Admission: RE | Disposition: A | Discharge status: Home / Self Care | Payer: Self-pay | Source: Home / Self Care | Attending: Internal Medicine

## 2023-11-11 ENCOUNTER — Other Ambulatory Visit: Payer: Self-pay

## 2023-11-11 ENCOUNTER — Encounter: Payer: Self-pay | Admitting: Internal Medicine

## 2023-11-11 DIAGNOSIS — E0590 Thyrotoxicosis, unspecified without thyrotoxic crisis or storm: Secondary | ICD-10-CM | POA: Diagnosis not present

## 2023-11-11 DIAGNOSIS — E669 Obesity, unspecified: Secondary | ICD-10-CM | POA: Insufficient documentation

## 2023-11-11 DIAGNOSIS — Z6835 Body mass index (BMI) 35.0-35.9, adult: Secondary | ICD-10-CM | POA: Diagnosis not present

## 2023-11-11 DIAGNOSIS — K219 Gastro-esophageal reflux disease without esophagitis: Secondary | ICD-10-CM | POA: Diagnosis not present

## 2023-11-11 DIAGNOSIS — Z1211 Encounter for screening for malignant neoplasm of colon: Secondary | ICD-10-CM | POA: Insufficient documentation

## 2023-11-11 DIAGNOSIS — K5730 Diverticulosis of large intestine without perforation or abscess without bleeding: Secondary | ICD-10-CM | POA: Diagnosis not present

## 2023-11-11 DIAGNOSIS — K6389 Other specified diseases of intestine: Secondary | ICD-10-CM | POA: Diagnosis not present

## 2023-11-11 DIAGNOSIS — Z8711 Personal history of peptic ulcer disease: Secondary | ICD-10-CM | POA: Insufficient documentation

## 2023-11-11 DIAGNOSIS — K640 First degree hemorrhoids: Secondary | ICD-10-CM | POA: Diagnosis not present

## 2023-11-11 DIAGNOSIS — R7303 Prediabetes: Secondary | ICD-10-CM | POA: Diagnosis not present

## 2023-11-11 DIAGNOSIS — I10 Essential (primary) hypertension: Secondary | ICD-10-CM | POA: Diagnosis not present

## 2023-11-11 DIAGNOSIS — G8929 Other chronic pain: Secondary | ICD-10-CM | POA: Diagnosis not present

## 2023-11-11 DIAGNOSIS — Z800 Family history of malignant neoplasm of digestive organs: Secondary | ICD-10-CM | POA: Insufficient documentation

## 2023-11-11 DIAGNOSIS — K635 Polyp of colon: Secondary | ICD-10-CM | POA: Insufficient documentation

## 2023-11-11 DIAGNOSIS — Z87728 Personal history of other specified (corrected) congenital malformations of nervous system and sense organs: Secondary | ICD-10-CM | POA: Diagnosis not present

## 2023-11-11 HISTORY — PX: HEMOSTASIS CLIP PLACEMENT: SHX6857

## 2023-11-11 HISTORY — PX: COLONOSCOPY: SHX5424

## 2023-11-11 HISTORY — PX: POLYPECTOMY: SHX149

## 2023-11-11 SURGERY — COLONOSCOPY
Anesthesia: General

## 2023-11-11 MED ORDER — PROPOFOL 10 MG/ML IV BOLUS
INTRAVENOUS | Status: DC | PRN
  Administered 2023-11-11 (×2): 20 mg via INTRAVENOUS
  Administered 2023-11-11 (×2): 60 mg via INTRAVENOUS

## 2023-11-11 MED ORDER — SODIUM CHLORIDE 0.9 % IV SOLN: INTRAVENOUS | Status: DC

## 2023-11-11 MED ORDER — LIDOCAINE HCL (CARDIAC) PF 100 MG/5ML IV SOSY
PREFILLED_SYRINGE | INTRAVENOUS | Status: DC | PRN
  Administered 2023-11-11 (×2): 100 mg via INTRAVENOUS

## 2023-11-11 MED ORDER — MIDAZOLAM HCL 2 MG/2ML IJ SOLN
INTRAMUSCULAR | Status: DC | PRN
  Administered 2023-11-11 (×2): 2 mg via INTRAVENOUS

## 2023-11-11 MED ORDER — MIDAZOLAM HCL 2 MG/2ML IJ SOLN
INTRAMUSCULAR | Status: AC
  Filled 2023-11-11: qty 2

## 2023-11-11 MED ORDER — PROPOFOL 500 MG/50ML IV EMUL
INTRAVENOUS | Status: DC | PRN
  Administered 2023-11-11 (×2): 165 ug/kg/min via INTRAVENOUS

## 2023-11-11 NOTE — Anesthesia Postprocedure Evaluation (Signed)
 Anesthesia Post Note  Patient: Abigail Huffman  Procedure(s) Performed: COLONOSCOPY POLYPECTOMY, INTESTINE CONTROL OF HEMORRHAGE, GI TRACT, ENDOSCOPIC, BY CLIPPING OR OVERSEWING  Patient location during evaluation: PACU Anesthesia Type: General Level of consciousness: awake and alert, oriented and patient cooperative Pain management: pain level controlled Vital Signs Assessment: post-procedure vital signs reviewed and stable Respiratory status: spontaneous breathing, nonlabored ventilation and respiratory function stable Cardiovascular status: blood pressure returned to baseline and stable Postop Assessment: adequate PO intake Anesthetic complications: no   No notable events documented.   Last Vitals:  Vitals:   11/11/23 0930 11/11/23 0944  BP: (!) 126/90 (!) 124/91  Pulse:    Resp: 18 14  Temp:    SpO2: 100% 100%    Last Pain:  Vitals:   11/11/23 0944  TempSrc:   PainSc: 0-No pain                 Alfonso Ruths

## 2023-11-11 NOTE — Op Note (Addendum)
 [facility] Patient Name: Abigail Huffman Procedure Date: 11/11/2023 8:48 AM MRN: [id number] Account #: [id number] Date of Birth: 01/09/1961 Admit Type: Outpatient Age: 63 Room: [facility] ENDO ROOM 1 Gender: Female Note Status: Supervisor Override Instrument Name: Colon Scope [id number] Procedure:             Colonoscopy Indications:           Screening in patient at increased risk: Family history                         of 1st-degree relative with colorectal cancer Providers:             Lucette Kratz K. Aundria MD, MD Referring MD:          Alm HERO. Epifanio (Referring MD) Medicines:             Propofol  per Anesthesia Complications:         No immediate complications. Estimated blood loss:                         Minimal. Procedure:             Pre-Anesthesia Assessment:                        - The risks and benefits of the procedure and the                         sedation options and risks were discussed with the                         patient. All questions were answered and informed                         consent was obtained.                        - Patient identification and proposed procedure were                         verified prior to the procedure by the nurse. The                         procedure was verified in the procedure room.                        - ASA Grade Assessment: III - A patient with severe                         systemic disease.                        - After reviewing the risks and benefits, the patient                         was deemed in satisfactory condition to undergo the                         procedure.  After obtaining informed consent, the colonoscope was                         passed under direct vision. Throughout the procedure,                         the patient's blood pressure, pulse, and oxygen                         saturations were monitored continuously. The                          Colonoscope was introduced through the anus and                         advanced to the the terminal ileum, with                         identification of the appendiceal orifice and IC                         valve. The colonoscopy was performed without                         difficulty. The patient tolerated the procedure well.                         The quality of the bowel preparation was good. The                         terminal ileum, ileocecal valve, appendiceal orifice,                         and rectum were photographed. Findings:      The perianal and digital rectal examinations were normal. Pertinent       negatives include normal sphincter tone and no palpable rectal lesions.      Non-bleeding internal hemorrhoids were found during retroflexion. The       hemorrhoids were Grade I (internal hemorrhoids that do not prolapse).      A few small-mouthed diverticula were found in the sigmoid colon.      An 8 mm polyp was found in the sigmoid colon. The polyp was sessile. To       prevent bleeding after the polypectomy, one hemostatic clip was       successfully placed (MR conditional). Clip manufacturer: [employer]. There was no bleeding at the end of the procedure. Estimated       blood loss was minimal. The polyp was removed with a cold snare.       Resection and retrieval were complete. Estimated blood loss was minimal.      The terminal ileum contained a few small diverticula.      The remainder of the exam in the terminal ileum was normal.      The exam was otherwise normal throughout the examined colon. Impression:            - Non-bleeding internal hemorrhoids.                        -  Diverticulosis in the sigmoid colon.                        - One 8 mm polyp in the sigmoid colon. Clip (MR                         conditional) was placed. Clip manufacturer: [employer].                        - Ileal diverticula.                         - No specimens collected. Recommendation:        - Patient has a contact number available for                         emergencies. The signs and symptoms of potential                         delayed complications were discussed with the patient.                         Return to normal activities tomorrow. Written                         discharge instructions were provided to the patient.                        - Resume previous diet.                        - Continue present medications.                        - Repeat colonoscopy is recommended for surveillance.                         The colonoscopy date will be determined after                         pathology results from today's exam become available                         for review.                        - Return to GI office PRN.                        - The findings and recommendations were discussed with                         the patient. Procedure Code(s):     --- Professional ---                        [phone], Colonoscopy, flexible; with removal of  tumor(s), polyp(s), or other lesion(s) by snare                         technique Diagnosis Code(s):     --- Professional ---                        K57.50, Diverticulosis of both small and large                         intestine without perforation or abscess without                         bleeding                        D12.5, Benign neoplasm of sigmoid colon                        K64.0, First degree hemorrhoids                        Z80.0, Family history of malignant neoplasm of                         digestive organs CPT copyright 2022 American [employer] Association. All rights reserved. The codes documented in this report are preliminary and upon coder review may  be revised to meet current compliance requirements. Ladell MARLA Boss MD, MD 11/11/2023 9:20:14 AM This report has been signed electronically. Number of Addenda: 0 Note  Initiated On: 11/11/2023 8:48 AM Scope Withdrawal Time: 0 hours 11 minutes 13 seconds  Total Procedure Duration: 0 hours 17 minutes 24 seconds  Estimated Blood Loss:  Estimated blood loss was minimal. Estimated blood                         loss: none. Estimated blood loss was minimal.                         Estimated blood loss was minimal.      [facility]

## 2023-11-11 NOTE — Interval H&P Note (Signed)
 History and Physical Interval Note:  11/11/2023 8:46 AM  Abigail Huffman  has presented today for surgery, with the diagnosis of Family hx of colon cancer (Z80.0) Screening for colon cancer (Z12.11).  The various methods of treatment have been discussed with the patient and family. After consideration of risks, benefits and other options for treatment, the patient has consented to  Procedure(s): COLONOSCOPY (N/A) as a surgical intervention.  The patient's history has been reviewed, patient examined, no change in status, stable for surgery.  I have reviewed the patient's chart and labs.  Questions were answered to the patient's satisfaction.     [place], [place] Morren

## 2023-11-11 NOTE — Anesthesia Procedure Notes (Signed)
 Procedure Name: General with mask airway Date/Time: 11/11/2023 8:55 AM  Performed by: Ledora Duncan, CRNAPre-anesthesia Checklist: Patient identified, Emergency Drugs available, Suction available and Patient being monitored Patient Re-evaluated:Patient Re-evaluated prior to induction Oxygen Delivery Method: Simple face mask Induction Type: IV induction Placement Confirmation: breath sounds checked- equal and bilateral and positive ETCO2 Dental Injury: Teeth and Oropharynx as per pre-operative assessment

## 2023-11-11 NOTE — Transfer of Care (Signed)
 Immediate Anesthesia Transfer of Care Note  Patient: Abigail Huffman  Procedure(s) Performed: COLONOSCOPY POLYPECTOMY, INTESTINE CONTROL OF HEMORRHAGE, GI TRACT, ENDOSCOPIC, BY CLIPPING OR OVERSEWING  Patient Location: Endoscopy Unit  Anesthesia Type:General  Level of Consciousness: awake, drowsy, and patient cooperative  Airway & Oxygen Therapy: Patient Spontanous Breathing and Patient connected to face mask oxygen  Post-op Assessment: Report given to RN and Post -op Vital signs reviewed and stable  Post vital signs: Reviewed and stable  Last Vitals:  Vitals Value Taken Time  BP 120/85 11/11/23 09:20  Temp    Pulse 99 11/11/23 09:21  Resp 15 11/11/23 09:25  SpO2 100 % 11/11/23 09:21  Vitals shown include unfiled device data.  Last Pain:  Vitals:   11/11/23 0920  TempSrc:   PainSc: Asleep         Complications: No notable events documented.

## 2023-11-11 NOTE — H&P (Signed)
 Outpatient short stay form Pre-procedure 11/11/2023 8:46 AM Abigail Huffman, M.D.  Primary Physician: Alm Needle, M.D.  Reason for visit:  Family history of colon cancer  History of present illness:   63 year old patient presenting for family history of colon cancer. Patient denies any change in bowel habits, rectal bleeding or involuntary weight loss.     Current Facility-Administered Medications:    0.9 %  sodium chloride  infusion, , Intravenous, Continuous, [place], [place] Duchemin K, MD, Last Rate: 20 mL/hr at 11/11/23 0835, New Bag at 11/11/23 0835  Medications Prior to Admission  Medication Sig Dispense Refill Last Dose/Taking   acetaminophen  (TYLENOL ) 500 MG tablet Take 500-1,000 mg by mouth every 6 (six) hours as needed (pain.).   Past Week   celecoxib (CELEBREX) 200 MG capsule Take 200 mg by mouth 2 (two) times daily.   Past Week   FLUoxetine (PROZAC) 20 MG capsule Take 20 mg by mouth in the morning. Take 1 capsule (20 mg total) by mouth as directed (daily, take wtih the 40 mg for total 60 mg)   Past Week   methimazole (TAPAZOLE) 5 MG tablet Take 5 mg by mouth 2 (two) times a week. Wednesdays & Sundays.   Past Week   pantoprazole  (PROTONIX ) 40 MG tablet Take 40 mg by mouth 2 (two) times daily before a meal.   Past Week   traZODone  (DESYREL ) 100 MG tablet Take 100 mg by mouth at bedtime.   Past Week   albuterol  (PROVENTIL  HFA;VENTOLIN  HFA) 108 (90 Base) MCG/ACT inhaler Inhale 2 puffs into the lungs every 6 (six) hours as needed for shortness of breath or wheezing.      ARIPiprazole (ABILIFY) 2 MG tablet Take 4 mg by mouth in the morning.      busPIRone (BUSPAR) 5 MG tablet Take 5 mg by mouth 2 (two) times daily.      cyclobenzaprine  (FLEXERIL ) 5 MG tablet Take 5 mg by mouth 3 (three) times daily as needed for muscle spasms.      FLUoxetine (PROZAC) 40 MG capsule Take 40 mg by mouth in the morning. Take 1 capsule (40 mg total) by mouth as directed (daily, take wtih the 20 mg for  total 60 mg)      hydrochlorothiazide  (HYDRODIURIL ) 25 MG tablet Take 25 mg by mouth daily.      Multiple Vitamin (MULTIVITAMIN WITH MINERALS) TABS tablet Take 1 tablet by mouth in the morning.      oxybutynin (DITROPAN-XL) 5 MG 24 hr tablet Take 5 mg by mouth in the morning.      oxyCODONE  (ROXICODONE ) 5 MG immediate release tablet Take 1-2 tablets (5-10 mg total) by mouth every 4 (four) hours as needed for moderate pain (pain score 4-6) or severe pain (pain score 7-10). 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    Physical Exam  Gen: Alert, oriented. Appears stated age.  HEENT: [place]/AT. PERRLA. Lungs: CTA, no wheezes. CV: RR nl S1, S2. Abd: soft, benign, no masses. BS+ Ext: No edema. Pulses 2+    Planned procedures: Proceed with colonoscopy. The patient understands the nature of the planned procedure, indications, risks, alternatives and potential complications including but not limited to bleeding, infection, perforation, damage to internal organs and possible oversedation/side effects from anesthesia. The patient agrees and gives consent to proceed.  Please refer to procedure notes for findings, recommendations and patient disposition/instructions.     Abigail Huffman, M.D. Gastroenterology 11/11/2023  8:46 AM

## 2023-11-11 NOTE — Anesthesia Preprocedure Evaluation (Addendum)
 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  Chronic pain    GI/Hepatic PUD,GERD  ,,  Endo/Other   Hyperthyroidism Obesity; prediabetes  Renal/GU Renal disease (nephrolithiasis)     Musculoskeletal  (+) Arthritis ,    Abdominal   Peds  Hematology  (+) REFUSES BLOOD PRODUCTS  Anesthesia Other Findings   Reproductive/Obstetrics                              Anesthesia Physical Anesthesia Plan  ASA: 2  Anesthesia Plan: General   Post-op Pain Management:    Induction: Intravenous  PONV Risk Score and Plan: 4 or greater and Propofol  infusion, TIVA and Treatment may vary due to age or medical condition  Airway Management Planned: Natural Airway  Additional Equipment:   Intra-op Plan:   Post-operative Plan:   Informed Consent: I have reviewed the patients History and Physical, chart, labs and discussed the procedure including the risks, benefits and alternatives for the proposed anesthesia with the patient or authorized representative who has indicated his/her understanding and acceptance.       Plan Discussed with: CRNA  Anesthesia Plan Comments: (LMA/GETA backup discussed.  Patient consented for risks of anesthesia including but not limited to:  - adverse reactions  to medications - damage to eyes, teeth, lips or other oral mucosa - nerve damage due to positioning  - sore throat or hoarseness - damage to heart, brain, nerves, lungs, other parts of body or loss of life  Informed patient about role of CRNA in peri- and intra-operative care.  Patient voiced understanding.)         Anesthesia Quick Evaluation

## 2023-11-12 LAB — SURGICAL PATHOLOGY

## 2023-12-08 DIAGNOSIS — M7502 Adhesive capsulitis of left shoulder: Secondary | ICD-10-CM | POA: Diagnosis not present

## 2023-12-08 DIAGNOSIS — M7582 Other shoulder lesions, left shoulder: Secondary | ICD-10-CM | POA: Diagnosis not present

## 2023-12-08 DIAGNOSIS — M75112 Incomplete rotator cuff tear or rupture of left shoulder, not specified as traumatic: Secondary | ICD-10-CM | POA: Diagnosis not present

## 2023-12-08 DIAGNOSIS — M7522 Bicipital tendinitis, left shoulder: Secondary | ICD-10-CM | POA: Diagnosis not present

## 2024-01-02 DIAGNOSIS — M542 Cervicalgia: Secondary | ICD-10-CM | POA: Diagnosis not present

## 2024-01-02 DIAGNOSIS — I10 Essential (primary) hypertension: Secondary | ICD-10-CM | POA: Diagnosis not present

## 2024-01-20 ENCOUNTER — Other Ambulatory Visit: Payer: Self-pay | Admitting: Infectious Diseases

## 2024-01-20 DIAGNOSIS — R300 Dysuria: Secondary | ICD-10-CM | POA: Diagnosis not present

## 2024-01-20 DIAGNOSIS — E0590 Thyrotoxicosis, unspecified without thyrotoxic crisis or storm: Secondary | ICD-10-CM | POA: Diagnosis not present

## 2024-01-20 DIAGNOSIS — Z0000 Encounter for general adult medical examination without abnormal findings: Secondary | ICD-10-CM | POA: Diagnosis not present

## 2024-01-20 DIAGNOSIS — Q0700 Arnold-Chiari syndrome without spina bifida or hydrocephalus: Secondary | ICD-10-CM | POA: Diagnosis not present

## 2024-01-20 DIAGNOSIS — F32A Depression, unspecified: Secondary | ICD-10-CM | POA: Diagnosis not present

## 2024-01-20 DIAGNOSIS — J4540 Moderate persistent asthma, uncomplicated: Secondary | ICD-10-CM | POA: Diagnosis not present

## 2024-01-20 DIAGNOSIS — Z1331 Encounter for screening for depression: Secondary | ICD-10-CM | POA: Diagnosis not present

## 2024-01-20 DIAGNOSIS — R519 Headache, unspecified: Secondary | ICD-10-CM | POA: Diagnosis not present

## 2024-01-20 DIAGNOSIS — Z1231 Encounter for screening mammogram for malignant neoplasm of breast: Secondary | ICD-10-CM

## 2024-01-20 DIAGNOSIS — Z23 Encounter for immunization: Secondary | ICD-10-CM | POA: Diagnosis not present

## 2024-01-20 DIAGNOSIS — R7303 Prediabetes: Secondary | ICD-10-CM | POA: Diagnosis not present

## 2024-01-20 DIAGNOSIS — I10 Essential (primary) hypertension: Secondary | ICD-10-CM | POA: Diagnosis not present

## 2024-01-20 DIAGNOSIS — E042 Nontoxic multinodular goiter: Secondary | ICD-10-CM | POA: Diagnosis not present

## 2024-01-20 DIAGNOSIS — G4700 Insomnia, unspecified: Secondary | ICD-10-CM | POA: Diagnosis not present

## 2024-01-21 ENCOUNTER — Ambulatory Visit
Admission: RE | Admit: 2024-01-21 | Discharge: 2024-01-21 | Disposition: A | Discharge status: Home / Self Care | Source: Ambulatory Visit | Attending: Infectious Diseases | Admitting: Infectious Diseases

## 2024-01-21 DIAGNOSIS — Z1231 Encounter for screening mammogram for malignant neoplasm of breast: Secondary | ICD-10-CM | POA: Diagnosis not present

## 2024-03-11 ENCOUNTER — Other Ambulatory Visit: Payer: Self-pay | Admitting: Infectious Diseases

## 2024-03-11 DIAGNOSIS — M25512 Pain in left shoulder: Secondary | ICD-10-CM | POA: Diagnosis not present

## 2024-03-11 DIAGNOSIS — Q0700 Arnold-Chiari syndrome without spina bifida or hydrocephalus: Secondary | ICD-10-CM | POA: Diagnosis not present

## 2024-03-11 DIAGNOSIS — R519 Headache, unspecified: Secondary | ICD-10-CM | POA: Diagnosis not present

## 2024-03-11 DIAGNOSIS — G8929 Other chronic pain: Secondary | ICD-10-CM | POA: Diagnosis not present

## 2024-03-12 ENCOUNTER — Encounter: Payer: Self-pay | Admitting: Infectious Diseases

## 2024-03-12 DIAGNOSIS — M67432 Ganglion, left wrist: Secondary | ICD-10-CM | POA: Diagnosis not present

## 2024-03-12 DIAGNOSIS — M25532 Pain in left wrist: Secondary | ICD-10-CM | POA: Diagnosis not present

## 2024-03-15 ENCOUNTER — Inpatient Hospital Stay
Admission: RE | Admit: 2024-03-15 | Discharge: 2024-03-15 | Disposition: A | Source: Ambulatory Visit | Attending: Infectious Diseases | Admitting: Infectious Diseases

## 2024-03-15 DIAGNOSIS — R519 Headache, unspecified: Secondary | ICD-10-CM

## 2024-03-15 DIAGNOSIS — Q0700 Arnold-Chiari syndrome without spina bifida or hydrocephalus: Secondary | ICD-10-CM
